# Patient Record
Sex: Male | Born: 1945 | Hispanic: No | Marital: Married | Smoking: Never smoker
Health system: Southern US, Community
[De-identification: ages and names within clinical notes are randomized; demographics above are authoritative.]

## PROBLEM LIST (undated history)

## (undated) DIAGNOSIS — I10 Essential (primary) hypertension: Secondary | ICD-10-CM

## (undated) DIAGNOSIS — E785 Hyperlipidemia, unspecified: Secondary | ICD-10-CM

## (undated) DIAGNOSIS — I878 Other specified disorders of veins: Secondary | ICD-10-CM

## (undated) HISTORY — PX: NO PAST SURGERIES: SHX2092

---

## 2011-09-05 ENCOUNTER — Ambulatory Visit: Payer: Self-pay | Admitting: Internal Medicine

## 2016-06-29 ENCOUNTER — Inpatient Hospital Stay
Admission: EM | Admit: 2016-06-29 | Discharge: 2016-07-01 | DRG: 603 | Disposition: A | Discharge status: Home / Self Care | Payer: Medicare Other | Attending: Internal Medicine | Admitting: Internal Medicine

## 2016-06-29 DIAGNOSIS — E669 Obesity, unspecified: Secondary | ICD-10-CM | POA: Diagnosis present

## 2016-06-29 DIAGNOSIS — L0390 Cellulitis, unspecified: Secondary | ICD-10-CM | POA: Diagnosis present

## 2016-06-29 DIAGNOSIS — E11622 Type 2 diabetes mellitus with other skin ulcer: Secondary | ICD-10-CM | POA: Diagnosis present

## 2016-06-29 DIAGNOSIS — I10 Essential (primary) hypertension: Secondary | ICD-10-CM | POA: Diagnosis present

## 2016-06-29 DIAGNOSIS — L03116 Cellulitis of left lower limb: Secondary | ICD-10-CM | POA: Diagnosis not present

## 2016-06-29 DIAGNOSIS — Z79899 Other long term (current) drug therapy: Secondary | ICD-10-CM

## 2016-06-29 DIAGNOSIS — E785 Hyperlipidemia, unspecified: Secondary | ICD-10-CM | POA: Diagnosis present

## 2016-06-29 DIAGNOSIS — Z6841 Body Mass Index (BMI) 40.0 and over, adult: Secondary | ICD-10-CM

## 2016-06-29 DIAGNOSIS — I83028 Varicose veins of left lower extremity with ulcer other part of lower leg: Secondary | ICD-10-CM | POA: Diagnosis present

## 2016-06-29 DIAGNOSIS — E119 Type 2 diabetes mellitus without complications: Secondary | ICD-10-CM | POA: Diagnosis present

## 2016-06-29 DIAGNOSIS — E1165 Type 2 diabetes mellitus with hyperglycemia: Secondary | ICD-10-CM | POA: Diagnosis present

## 2016-06-29 DIAGNOSIS — I878 Other specified disorders of veins: Secondary | ICD-10-CM | POA: Diagnosis present

## 2016-06-29 HISTORY — DX: Essential (primary) hypertension: I10

## 2016-06-29 HISTORY — DX: Hyperlipidemia, unspecified: E78.5

## 2016-06-29 HISTORY — DX: Other specified disorders of veins: I87.8

## 2016-06-29 LAB — CBC WITH DIFFERENTIAL/PLATELET
BASOS ABS: 0.3 10*3/uL — AB (ref 0–0.1)
BASOS PCT: 2 %
EOS ABS: 0.5 10*3/uL (ref 0–0.7)
Eosinophils Relative: 4 %
HEMATOCRIT: 42.4 % (ref 40.0–52.0)
HEMOGLOBIN: 14.2 g/dL (ref 13.0–18.0)
LYMPHS PCT: 25 %
Lymphs Abs: 3.6 10*3/uL (ref 1.0–3.6)
MCH: 26.4 pg (ref 26.0–34.0)
MCHC: 33.5 g/dL (ref 32.0–36.0)
MCV: 79 fL — ABNORMAL LOW (ref 80.0–100.0)
MONO ABS: 1 10*3/uL (ref 0.2–1.0)
MONOS PCT: 7 %
NEUTROS ABS: 9.1 10*3/uL — AB (ref 1.4–6.5)
Neutrophils Relative %: 62 %
Platelets: 290 10*3/uL (ref 150–440)
RBC: 5.37 MIL/uL (ref 4.40–5.90)
RDW: 14.3 % (ref 11.5–14.5)
WBC: 14.5 10*3/uL — AB (ref 3.8–10.6)

## 2016-06-29 LAB — BASIC METABOLIC PANEL
ANION GAP: 10 (ref 5–15)
BUN: 13 mg/dL (ref 6–20)
CHLORIDE: 100 mmol/L — AB (ref 101–111)
CO2: 25 mmol/L (ref 22–32)
Calcium: 9.6 mg/dL (ref 8.9–10.3)
Creatinine, Ser: 0.8 mg/dL (ref 0.61–1.24)
GFR calc non Af Amer: >60 mL/min (ref >60)
Glucose, Bld: 111 mg/dL — ABNORMAL HIGH (ref 65–99)
POTASSIUM: 3.9 mmol/L (ref 3.5–5.1)
SODIUM: 135 mmol/L (ref 135–145)

## 2016-06-29 LAB — LACTIC ACID, PLASMA: LACTIC ACID, VENOUS: 1.9 mmol/L (ref 0.5–1.9)

## 2016-06-29 LAB — GLUCOSE, CAPILLARY: GLUCOSE-CAPILLARY: 112 mg/dL — AB (ref 65–99)

## 2016-06-29 MED ORDER — CLINDAMYCIN PHOSPHATE 600 MG/50ML IV SOLN
600.0000 mg | Freq: Once | INTRAVENOUS | Status: AC
  Administered 2016-06-29: 600 mg via INTRAVENOUS
  Filled 2016-06-29: qty 50

## 2016-06-29 MED ORDER — CLINDAMYCIN HCL 300 MG PO CAPS: 300.0000 mg | ORAL_CAPSULE | Freq: Three times a day (TID) | ORAL | 0 refills | Status: DC

## 2016-06-29 NOTE — ED Notes (Signed)
Pt reports he is taking Augmentin twice a day since Monday

## 2016-06-29 NOTE — ED Notes (Signed)
Pt reports being bitten on left posterior leg approx 15 days ago, and treated by Dr Welton Flakes. Pt saw MD Su Hilt on Monday because he had 2 new blisters on his foot (no blisters prior). Pt's foot is not covered in blisters with weeping wound on medial and posterior leg

## 2016-06-29 NOTE — H&P (Signed)
[facility] - [place] at [facility]   PATIENT NAME: Jon Thomas    MR#:  [id number]  DATE OF BIRTH:  17-Jul-1946  DATE OF ADMISSION:  06/29/2016  PRIMARY CARE PHYSICIAN: Margaretann Loveless, MD   REQUESTING/REFERRING PHYSICIAN: Don Perking, MD  CHIEF COMPLAINT:   Chief Complaint  Patient presents with  . Open Wound  . Wound Infection    HISTORY OF PRESENT ILLNESS:  Jon Thomas  is a 70 y.o. male who presents with Progressive right lower extremity cellulitis. Patient states that he has chronic venous stasis in both legs, and about 2 weeks ago sustained some injury to his left lower extremity. He is not sure if it was from a bug bite or a plan scratch. He is taking an oral antibiotic in outpatient setting for some time, but his injury was not improving and he then developed blisters and cellulitis. He did have placement of what sounds like it may have been an Radio broadcast assistant in the outpatient setting, which still also did not help. Came to the ED for evaluation. Hospitals were initially called for consultation for admission IV antibiotics, but the patient states that he is alone and drives in his household and he cannot be admitted here due to the same.However, when the ED physician and back to speak with them to discharge and he stated that he did want to stay for a round of IV antibiotics and to see the wound consult team.  PAST MEDICAL HISTORY:   Past Medical History:  Diagnosis Date  . HLD (hyperlipidemia)   . Hypertension   . Venous stasis     PAST SURGICAL HISTOIRY:   Past Surgical History:  Procedure Laterality Date  . NO PAST SURGERIES      SOCIAL HISTORY:   Social History  Substance Use Topics  . Smoking status: Never Smoker  . Smokeless tobacco: Never Used  . Alcohol use No    FAMILY HISTORY:  No family history on file.  DRUG ALLERGIES:  No Known Allergies  REVIEW OF SYSTEMS:  Review of Systems  Constitutional: Negative for chills,  fever, malaise/fatigue and weight loss.  HENT: Negative for ear pain, hearing loss and tinnitus.   Eyes: Negative for blurred vision, double vision, pain and redness.  Respiratory: Negative for cough, hemoptysis and shortness of breath.   Cardiovascular: Negative for chest pain, palpitations, orthopnea and leg swelling.  Gastrointestinal: Negative for abdominal pain, constipation, diarrhea, nausea and vomiting.  Genitourinary: Negative for dysuria, frequency and hematuria.  Musculoskeletal: Negative for back pain, joint pain and neck pain.  Skin:       Right lower extremity cellulitis, wound, blisters  Neurological: Negative for dizziness, tremors, focal weakness and weakness.  Endo/Heme/Allergies: Negative for polydipsia. Does not bruise/bleed easily.  Psychiatric/Behavioral: Negative for depression. The patient is not nervous/anxious and does not have insomnia.     MEDICATIONS AT HOME:   Prior to Admission medications   Medication Sig Start Date End Date Taking? Authorizing Provider  amLODipine-atorvastatin (CADUET) 5-10 MG tablet Take 1 tablet by mouth daily.   Yes Historical Provider, MD  amoxicillin-clavulanate (AUGMENTIN) 875-125 MG tablet Take 1 tablet by mouth every 12 (twelve) hours. 06/25/16  Yes Historical Provider, MD  lisinopril (PRINIVIL,ZESTRIL) 5 MG tablet Take 5 mg by mouth daily.   Yes Historical Provider, MD  metoprolol succinate (TOPROL-XL) 25 MG 24 hr tablet Take 25 mg by mouth daily.   Yes Historical Provider, MD      VITAL SIGNS:   Vitals:  06/29/16 1646 06/29/16 2023  BP: (!) 159/66 (!) 165/62  Pulse: 99 90  Resp: 18 20  Temp: 98.1 F (36.7 C)   TempSrc: Oral   SpO2: 100% 97%  Weight: 102.5 kg (226 lb)   Height: 5\' 4"  (1.626 m)    Wt Readings from Last 3 Encounters:  06/29/16 102.5 kg (226 lb)    PHYSICAL EXAMINATION:  Physical Exam  Vitals reviewed. Constitutional: He is oriented to person, place, and time. He appears well-developed and  well-nourished. No distress.  HENT:  Head: Normocephalic and atraumatic.  Mouth/Throat: Oropharynx is clear and moist.  Eyes: Conjunctivae and EOM are normal. Pupils are equal, round, and reactive to light. No scleral icterus.  Neck: Normal range of motion. Neck supple. No JVD present. No thyromegaly present.  Cardiovascular: Normal rate, regular rhythm and intact distal pulses.  Exam reveals no gallop and no friction rub.   No murmur heard. Respiratory: Effort normal and breath sounds normal. No respiratory distress. He has no wheezes. He has no rales.  GI: Soft. Bowel sounds are normal. He exhibits no distension. There is no tenderness.  Musculoskeletal: Normal range of motion. He exhibits no edema.  No arthritis, no gout  Lymphadenopathy:    He has no cervical adenopathy.  Neurological: He is alert and oriented to person, place, and time. No cranial nerve deficit.  No dysarthria, no aphasia  Skin: Skin is warm and dry. No rash noted. There is erythema.  Right lower extremity medial wound with significant surrounding cellulitis and blisters across the top of his foot  Psychiatric: He has a normal mood and affect. His behavior is normal. Judgment and thought content normal.     LABORATORY PANEL:   CBC  Recent Labs Lab 06/29/16 1656  WBC 14.5*  HGB 14.2  HCT 42.4  PLT 290   ------------------------------------------------------------------------------------------------------------------  Chemistries   Recent Labs Lab 06/29/16 1656  NA 135  K 3.9  CL 100*  CO2 25  GLUCOSE 111*  BUN 13  CREATININE 0.80  CALCIUM 9.6   ------------------------------------------------------------------------------------------------------------------  Cardiac Enzymes No results for input(s): TROPONINI in the last 168 hours. ------------------------------------------------------------------------------------------------------------------  RADIOLOGY:  No results found.  EKG:  No  orders found for this or any previous visit.  IMPRESSION AND PLAN:  Principal Problem:   Cellulitis - Patient initially did not want to be admitted, and this that was originally placed as a consult note, but then the patient changed his mind and requested IV antibiotics. We'll admit him with IV antibiotics per cellulitis order set and a wound team consult for the morning.   HTN - currently stable, continue home meds   HLD - continue home meds  All the records are reviewed and case discussed with ED provider. Management plans discussed with the patient and/or family.  CODE STATUS: Full Code Status History    This patient does not have a recorded code status. Please follow your organizational policy for patients in this situation.      TOTAL TIME TAKING CARE OF THIS PATIENT: 45 minutes.    Sriyan Cutting FIELDING 06/29/2016, 11:32 PM  [facility]  Office  [phone]  CC: Primary care Physician: Margaretann Loveless, MD

## 2016-06-29 NOTE — ED Triage Notes (Addendum)
Pt arrives to ER via POV c/o left leg infection and left toe infection. Pt seen at [facility] last Tuesday for small wound. Pt states worsening of wound and now wound to left toe as well.   Wound to inner left lower leg and top of left foot, yellow exudate, crusts formed. Leg rewrapped.

## 2016-06-29 NOTE — ED Provider Notes (Addendum)
[facility] Emergency Department Provider Note  ____________________________________________  Time seen: Approximately 9:09 PM  I have reviewed the triage vital signs and the nursing notes.   HISTORY  Chief Complaint Open Wound and Wound Infection   HPI Jon Thomas is a 70 y.o. male history of hypertension and peripheral vascular disease and presents for evaluation of wound to his left lower extremity. Patient reports 2 weeks ago he was trying to clean his satellite dish near some plants when he felt a sting to his leg. He did not see any insects or plants with thorns. He developed a small ulceration. He was seen by his primary care doctor who started him on some topical antibiotics. The wound progressively worse and he was started on Augmentin on 7/31. He followed up at the opening clinic today and the wound had gotten progressively worse so he was sent here for evaluation. She denies pain, fever, nausea, vomiting, history of diabetes. He reports that the wound has gotten markedly larger and now has blisters all over his foot.  Past Medical History:  Diagnosis Date  . HLD (hyperlipidemia)   . Hypertension   . Venous stasis     Patient Active Problem List   Diagnosis Date Noted  . Cellulitis 06/29/2016  . HTN (hypertension) 06/29/2016  . HLD (hyperlipidemia) 06/29/2016    Past Surgical History:  Procedure Laterality Date  . NO PAST SURGERIES      Prior to Admission medications   Medication Sig Start Date End Date Taking? Authorizing Provider  amLODipine-atorvastatin (CADUET) 5-10 MG tablet Take 1 tablet by mouth daily.   Yes Historical Provider, MD  amoxicillin-clavulanate (AUGMENTIN) 875-125 MG tablet Take 1 tablet by mouth every 12 (twelve) hours. 06/25/16  Yes Historical Provider, MD  lisinopril (PRINIVIL,ZESTRIL) 5 MG tablet Take 5 mg by mouth daily.   Yes Historical Provider, MD  metoprolol succinate (TOPROL-XL) 25 MG 24 hr tablet Take 25  mg by mouth daily.   Yes Historical Provider, MD  clindamycin (CLEOCIN) 300 MG capsule Take 1 capsule (300 mg total) by mouth 3 (three) times daily. 06/29/16 07/09/16  Nita Sickle, MD    Allergies Review of patient's allergies indicates no known allergies.  No family history on file.  Social History Social History  Substance Use Topics  . Smoking status: Never Smoker  . Smokeless tobacco: Never Used  . Alcohol use No    Review of Systems  Constitutional: Negative for fever. Eyes: Negative for visual changes. ENT: Negative for sore throat. Cardiovascular: Negative for chest pain. Respiratory: Negative for shortness of breath. Gastrointestinal: Negative for abdominal pain, vomiting or diarrhea. Genitourinary: Negative for dysuria. Musculoskeletal: Negative for back pain. Skin: Negative for rash. + large wound ot LLE Neurological: Negative for headaches, weakness or numbness.  ____________________________________________   PHYSICAL EXAM:  VITAL SIGNS: ED Triage Vitals  Enc Vitals Group     BP 06/29/16 1646 (!) 159/66     Pulse Rate 06/29/16 1646 99     Resp 06/29/16 1646 18     Temp 06/29/16 1646 98.1 F (36.7 C)     Temp Source 06/29/16 1646 Oral     SpO2 06/29/16 1646 100 %     Weight 06/29/16 1646 226 lb (102.5 kg)     Height 06/29/16 1646 5\' 4"  (1.626 m)     Head Circumference --      Peak Flow --      Pain Score 06/29/16 1647 0     Pain Loc --  Pain Edu? --      Excl. in GC? --     Constitutional: Alert and oriented. Well appearing and in no apparent distress. HEENT:      Head: Normocephalic and atraumatic.         Eyes: Conjunctivae are normal. Sclera is non-icteric. EOMI. PERRL      Mouth/Throat: Mucous membranes are moist.       Neck: Supple with no signs of meningismus. Cardiovascular: Regular rate and rhythm. No murmurs, gallops, or rubs. 2+ symmetrical distal pulses are present in all extremities. No JVD. Respiratory: Normal respiratory  effort. Lungs are clear to auscultation bilaterally. No wheezes, crackles, or rhonchi.  Musculoskeletal: Nontender with normal range of motion in all extremities. No edema, cyanosis, or erythema of extremities. Neurologic: Normal speech and language. Face is symmetric. Moving all extremities. No gross focal neurologic deficits are appreciated. Skin: Patient has a large shallow laceration involving most of his calf on the left lower extremity with a bullae on the dorsum of his foot, there is purulent discharge and foul odor. Patient also has changes of chronic venous stasis on bilateral lower extremities.  Psychiatric: Mood and affect are normal. Speech and behavior are normal.  ____________________________________________   LABS (all labs ordered are listed, but only abnormal results are displayed)  Labs Reviewed  CBC WITH DIFFERENTIAL/PLATELET - Abnormal; Notable for the following:       Result Value   WBC 14.5 (*)    MCV 79.0 (*)    Neutro Abs 9.1 (*)    Basophils Absolute 0.3 (*)    All other components within normal limits  GLUCOSE, CAPILLARY - Abnormal; Notable for the following:    Glucose-Capillary 112 (*)    All other components within normal limits  BASIC METABOLIC PANEL - Abnormal; Notable for the following:    Chloride 100 (*)    Glucose, Bld 111 (*)    All other components within normal limits  AEROBIC CULTURE (SUPERFICIAL SPECIMEN)  LACTIC ACID, PLASMA  CBG MONITORING, ED   ____________________________________________  EKG  none ____________________________________________  RADIOLOGY  none  ____________________________________________   PROCEDURES  Procedure(s) performed: None Procedures Critical Care performed:  None ____________________________________________   INITIAL IMPRESSION / ASSESSMENT AND PLAN / ED COURSE   70 y.o. male history of hypertension and peripheral vascular disease and presents for evaluation of wound to his left lower extremity.  Patient has failed outpatient antibiotic and the wound has gotten progressively worse. Now it purulent discharge and foul smell, also with blisters on the dorsum of his foot. Patient has changes of chronic venous stasis. No fever. White count here is elevated at 14.5. Will start patient on IV clindamycin, sent lactate and blood cultures, and admit for IV antibiotics and wound care. Wound swab sent as well.  Clinical Course    Pertinent labs & imaging results that were available during my care of the patient were reviewed by me and considered in my medical decision making (see chart for details).    ____________________________________________   FINAL CLINICAL IMPRESSION(S) / ED DIAGNOSES  Final diagnoses:  Cellulitis of left lower extremity      NEW MEDICATIONS STARTED DURING THIS VISIT:  New Prescriptions   CLINDAMYCIN (CLEOCIN) 300 MG CAPSULE    Take 1 capsule (300 mg total) by mouth 3 (three) times daily.     Note:  This document was prepared using Dragon voice recognition software and may include unintentional dictation errors.    Nita Sickle, MD 06/29/16 [phone]  Nita Sickle, MD 06/29/16 [phone]

## 2016-06-29 NOTE — Consult Note (Deleted)
[facility] - [place] at [facility]   PATIENT NAME: Jon Thomas    MR#:  [id number]  DATE OF BIRTH:  February 03, 1946  DATE OF ADMISSION:  06/29/2016  PRIMARY CARE PHYSICIAN: Margaretann Loveless, MD   REQUESTING/REFERRING PHYSICIAN: Don Perking, MD  CHIEF COMPLAINT:   Chief Complaint  Patient presents with  . Open Wound  . Wound Infection    HISTORY OF PRESENT ILLNESS:  Jon Thomas  is a 70 y.o. male who presents with Progressive right lower extremity cellulitis. Patient states that he has chronic venous stasis in both legs, and about 2 weeks ago sustained some injury to his left lower extremity. He is not sure if it was from a bug bite or a plan scratch. He is taking an oral antibiotic in outpatient setting for some time, but his injury was not improving and he then developed blisters and cellulitis. He did have placement of what sounds like it may have been an Radio broadcast assistant in the outpatient setting, which still also did not help. Came to the ED for evaluation. Hospitals were initially called for consultation for admission IV antibiotics, but the patient states that he is alone and drives in his household and he cannot be admitted here due to the same.  PAST MEDICAL HISTORY:   Past Medical History:  Diagnosis Date  . HLD (hyperlipidemia)   . Hypertension   . Venous stasis     PAST SURGICAL HISTOIRY:   Past Surgical History:  Procedure Laterality Date  . NO PAST SURGERIES      SOCIAL HISTORY:   Social History  Substance Use Topics  . Smoking status: Never Smoker  . Smokeless tobacco: Never Used  . Alcohol use No    FAMILY HISTORY:  No family history on file.  DRUG ALLERGIES:  No Known Allergies  REVIEW OF SYSTEMS:  Review of Systems  Constitutional: Negative for chills, fever, malaise/fatigue and weight loss.  HENT: Negative for ear pain, hearing loss and tinnitus.   Eyes: Negative for blurred vision, double vision, pain and redness.   Respiratory: Negative for cough, hemoptysis and shortness of breath.   Cardiovascular: Negative for chest pain, palpitations, orthopnea and leg swelling.  Gastrointestinal: Negative for abdominal pain, constipation, diarrhea, nausea and vomiting.  Genitourinary: Negative for dysuria, frequency and hematuria.  Musculoskeletal: Negative for back pain, joint pain and neck pain.  Skin:       Right lower extremity cellulitis, wound, blisters  Neurological: Negative for dizziness, tremors, focal weakness and weakness.  Endo/Heme/Allergies: Negative for polydipsia. Does not bruise/bleed easily.  Psychiatric/Behavioral: Negative for depression. The patient is not nervous/anxious and does not have insomnia.     MEDICATIONS AT HOME:   Prior to Admission medications   Medication Sig Start Date End Date Taking? Authorizing Provider  amLODipine-atorvastatin (CADUET) 5-10 MG tablet Take 1 tablet by mouth daily.   Yes Historical Provider, MD  amoxicillin-clavulanate (AUGMENTIN) 875-125 MG tablet Take 1 tablet by mouth every 12 (twelve) hours. 06/25/16  Yes Historical Provider, MD  lisinopril (PRINIVIL,ZESTRIL) 5 MG tablet Take 5 mg by mouth daily.   Yes Historical Provider, MD  metoprolol succinate (TOPROL-XL) 25 MG 24 hr tablet Take 25 mg by mouth daily.   Yes Historical Provider, MD      VITAL SIGNS:   Vitals:   06/29/16 1646 06/29/16 2023  BP: (!) 159/66 (!) 165/62  Pulse: 99 90  Resp: 18 20  Temp: 98.1 F (36.7 C)   TempSrc: Oral   SpO2: 100%  97%  Weight: 102.5 kg (226 lb)   Height:  (1.626 m)    Wt Readings from Last 3 Encounters:  06/29/16 102.5 kg (226 lb)    PHYSICAL EXAMINATION:  Physical Exam  Vitals reviewed. Constitutional: He is oriented to person, place, and time. He appears well-developed and well-nourished. No distress.  HENT:  Head: Normocephalic and atraumatic.  Mouth/Throat: Oropharynx is clear and moist.  Eyes: Conjunctivae and EOM are normal. Pupils are  equal, round, and reactive to light. No scleral icterus.  Neck: Normal range of motion. Neck supple. No JVD present. No thyromegaly present.  Cardiovascular: Normal rate, regular rhythm and intact distal pulses.  Exam reveals no gallop and no friction rub.   No murmur heard. Respiratory: Effort normal and breath sounds normal. No respiratory distress. He has no wheezes. He has no rales.  GI: Soft. Bowel sounds are normal. He exhibits no distension. There is no tenderness.  Musculoskeletal: Normal range of motion. He exhibits no edema.  No arthritis, no gout  Lymphadenopathy:    He has no cervical adenopathy.  Neurological: He is alert and oriented to person, place, and time. No cranial nerve deficit.  No dysarthria, no aphasia  Skin: Skin is warm and dry. No rash noted. There is erythema.  Right lower extremity medial wound with significant surrounding cellulitis and blisters across the top of his foot  Psychiatric: He has a normal mood and affect. His behavior is normal. Judgment and thought content normal.     LABORATORY PANEL:   CBC  Recent Labs Lab 06/29/16 1656  WBC 14.5*  HGB 14.2  HCT 42.4  PLT 290   ------------------------------------------------------------------------------------------------------------------  Chemistries   Recent Labs Lab 06/29/16 1656  NA 135  K 3.9  CL 100*  CO2 25  GLUCOSE 111*  BUN 13  CREATININE 0.80  CALCIUM 9.6   ------------------------------------------------------------------------------------------------------------------  Cardiac Enzymes No results for input(s): TROPONINI in the last 168 hours. ------------------------------------------------------------------------------------------------------------------  RADIOLOGY:  No results found.  EKG:  No orders found for this or any previous visit.  IMPRESSION AND PLAN:  Principal Problem:   Cellulitis - Patient initially did not want to be admitted, and this that was  originally placed as a consult note, but then the patient changed his mind and requested IV antibiotics. We'll admit him with IV antibiotics per cellulitis order set and a wound team consult for the morning.   HTN - currently stable, continue home meds   HLD - continue home meds  All the records are reviewed and case discussed with ED provider. Management plans discussed with the patient and/or family.  CODE STATUS: Full Code Status History    This patient does not have a recorded code status. Please follow your organizational policy for patients in this situation.      TOTAL TIME TAKING CARE OF THIS PATIENT: 45 minutes.    Corrin Hingle FIELDING 06/29/2016, 11:32 PM  [facility]  Office  [phone]  CC: Primary care Physician: Margaretann Loveless, MD

## 2016-06-30 DIAGNOSIS — I10 Essential (primary) hypertension: Secondary | ICD-10-CM | POA: Diagnosis present

## 2016-06-30 DIAGNOSIS — E119 Type 2 diabetes mellitus without complications: Secondary | ICD-10-CM | POA: Diagnosis present

## 2016-06-30 DIAGNOSIS — L03116 Cellulitis of left lower limb: Secondary | ICD-10-CM | POA: Diagnosis present

## 2016-06-30 DIAGNOSIS — I878 Other specified disorders of veins: Secondary | ICD-10-CM | POA: Diagnosis present

## 2016-06-30 DIAGNOSIS — Z79899 Other long term (current) drug therapy: Secondary | ICD-10-CM | POA: Diagnosis not present

## 2016-06-30 DIAGNOSIS — E11622 Type 2 diabetes mellitus with other skin ulcer: Secondary | ICD-10-CM | POA: Diagnosis present

## 2016-06-30 DIAGNOSIS — E785 Hyperlipidemia, unspecified: Secondary | ICD-10-CM | POA: Diagnosis present

## 2016-06-30 DIAGNOSIS — I83028 Varicose veins of left lower extremity with ulcer other part of lower leg: Secondary | ICD-10-CM | POA: Diagnosis present

## 2016-06-30 DIAGNOSIS — Z6841 Body Mass Index (BMI) 40.0 and over, adult: Secondary | ICD-10-CM | POA: Diagnosis not present

## 2016-06-30 DIAGNOSIS — E669 Obesity, unspecified: Secondary | ICD-10-CM | POA: Diagnosis present

## 2016-06-30 DIAGNOSIS — E1165 Type 2 diabetes mellitus with hyperglycemia: Secondary | ICD-10-CM | POA: Diagnosis present

## 2016-06-30 LAB — BASIC METABOLIC PANEL
Anion gap: 9 (ref 5–15)
BUN: 11 mg/dL (ref 6–20)
CHLORIDE: 102 mmol/L (ref 101–111)
CO2: 25 mmol/L (ref 22–32)
CREATININE: 0.85 mg/dL (ref 0.61–1.24)
Calcium: 8.8 mg/dL — ABNORMAL LOW (ref 8.9–10.3)
GFR calc non Af Amer: >60 mL/min (ref >60)
Glucose, Bld: 140 mg/dL — ABNORMAL HIGH (ref 65–99)
Potassium: 3.9 mmol/L (ref 3.5–5.1)
Sodium: 136 mmol/L (ref 135–145)

## 2016-06-30 LAB — CBC
HCT: 40.1 % (ref 40.0–52.0)
HEMOGLOBIN: 13.2 g/dL (ref 13.0–18.0)
MCH: 26.2 pg (ref 26.0–34.0)
MCHC: 32.9 g/dL (ref 32.0–36.0)
MCV: 79.5 fL — AB (ref 80.0–100.0)
PLATELETS: 267 10*3/uL (ref 150–440)
RBC: 5.05 MIL/uL (ref 4.40–5.90)
RDW: 14.5 % (ref 11.5–14.5)
WBC: 13.1 10*3/uL — ABNORMAL HIGH (ref 3.8–10.6)

## 2016-06-30 LAB — HEMOGLOBIN A1C: Hgb A1c MFr Bld: 7.6 % — ABNORMAL HIGH (ref 4.0–6.0)

## 2016-06-30 MED ORDER — VANCOMYCIN HCL 10 G IV SOLR
1250.0000 mg | Freq: Two times a day (BID) | INTRAVENOUS | Status: DC
  Administered 2016-06-30 – 2016-07-01 (×3): 1250 mg via INTRAVENOUS
  Filled 2016-06-30 (×4): qty 1250

## 2016-06-30 MED ORDER — METOPROLOL SUCCINATE ER 25 MG PO TB24
25.0000 mg | ORAL_TABLET | Freq: Every day | ORAL | Status: DC
  Administered 2016-06-30: 25 mg via ORAL
  Filled 2016-06-30: qty 1

## 2016-06-30 MED ORDER — VANCOMYCIN HCL IN DEXTROSE 1-5 GM/200ML-% IV SOLN
1000.0000 mg | Freq: Once | INTRAVENOUS | Status: DC
  Filled 2016-06-30: qty 200

## 2016-06-30 MED ORDER — PIPERACILLIN-TAZOBACTAM 3.375 G IVPB
3.3750 g | Freq: Three times a day (TID) | INTRAVENOUS | Status: DC
  Administered 2016-06-30 – 2016-07-01 (×5): 3.375 g via INTRAVENOUS
  Filled 2016-06-30 (×7): qty 50

## 2016-06-30 MED ORDER — VANCOMYCIN HCL 10 G IV SOLR
1250.0000 mg | Freq: Once | INTRAVENOUS | Status: AC
  Administered 2016-06-30: 1250 mg via INTRAVENOUS
  Filled 2016-06-30: qty 1250

## 2016-06-30 MED ORDER — LISINOPRIL 5 MG PO TABS
5.0000 mg | ORAL_TABLET | Freq: Every day | ORAL | Status: DC
  Administered 2016-06-30 – 2016-07-01 (×2): 5 mg via ORAL
  Filled 2016-06-30 (×2): qty 1

## 2016-06-30 MED ORDER — AMLODIPINE BESYLATE 5 MG PO TABS
5.0000 mg | ORAL_TABLET | Freq: Every day | ORAL | Status: DC
  Administered 2016-06-30: 5 mg via ORAL
  Filled 2016-06-30: qty 1

## 2016-06-30 MED ORDER — AMLODIPINE BESYLATE 5 MG PO TABS
5.0000 mg | ORAL_TABLET | Freq: Every day | ORAL | Status: DC
  Administered 2016-07-01: 5 mg via ORAL
  Filled 2016-06-30: qty 1

## 2016-06-30 MED ORDER — PIPERACILLIN-TAZOBACTAM 3.375 G IVPB 30 MIN
3.3750 g | Freq: Once | INTRAVENOUS | Status: DC
  Filled 2016-06-30: qty 50

## 2016-06-30 MED ORDER — ENOXAPARIN SODIUM 40 MG/0.4ML ~~LOC~~ SOLN
40.0000 mg | Freq: Every day | SUBCUTANEOUS | Status: DC
  Administered 2016-06-30 (×2): 40 mg via SUBCUTANEOUS
  Filled 2016-06-30 (×2): qty 0.4

## 2016-06-30 MED ORDER — METOPROLOL SUCCINATE ER 25 MG PO TB24
25.0000 mg | ORAL_TABLET | Freq: Every day | ORAL | Status: DC
  Filled 2016-06-30: qty 1

## 2016-06-30 MED ORDER — AMLODIPINE-ATORVASTATIN 5-10 MG PO TABS: 1.0000 | ORAL_TABLET | Freq: Every day | ORAL | Status: DC

## 2016-06-30 MED ORDER — ONDANSETRON HCL 4 MG/2ML IJ SOLN: 4.0000 mg | Freq: Four times a day (QID) | INTRAMUSCULAR | Status: DC | PRN

## 2016-06-30 MED ORDER — ATORVASTATIN CALCIUM 10 MG PO TABS
10.0000 mg | ORAL_TABLET | Freq: Every day | ORAL | Status: DC
  Administered 2016-06-30: 10 mg via ORAL
  Filled 2016-06-30: qty 1

## 2016-06-30 MED ORDER — OXYCODONE HCL 5 MG PO TABS: 5.0000 mg | ORAL_TABLET | ORAL | Status: DC | PRN

## 2016-06-30 MED ORDER — ACETAMINOPHEN 325 MG PO TABS: 650.0000 mg | ORAL_TABLET | Freq: Four times a day (QID) | ORAL | Status: DC | PRN

## 2016-06-30 MED ORDER — ONDANSETRON HCL 4 MG PO TABS: 4.0000 mg | ORAL_TABLET | Freq: Four times a day (QID) | ORAL | Status: DC | PRN

## 2016-06-30 MED ORDER — ACETAMINOPHEN 650 MG RE SUPP: 650.0000 mg | Freq: Four times a day (QID) | RECTAL | Status: DC | PRN

## 2016-06-30 MED ORDER — ATORVASTATIN CALCIUM 10 MG PO TABS
10.0000 mg | ORAL_TABLET | Freq: Every day | ORAL | Status: DC
  Filled 2016-06-30: qty 1

## 2016-06-30 NOTE — Progress Notes (Signed)
Patient has wound consult.  Have called all three numbers we have listed in our directory, [phone], [phone] and [phone]. No response.  Patient frustrated, Dr. Allena Katz aware. Loel Ro, RN 06/30/16 at [phone]

## 2016-06-30 NOTE — Progress Notes (Signed)
Pharmacy Antibiotic Note  Jon Thomas is a 70 y.o. male admitted on 06/29/2016 with cellulitis.  Pharmacy has been consulted for Zosyn and vancomycin dosing.  Plan: 1. Zosyn 3.375 gm IV Q8H EI 2. Vancomycin 1.25 gm IV x 1 followed in 6 hours (stacked dosing) by vancomycin 1.25 gm IV Q12H, predicted trough 15 mcg/mL. Pharmacy will continue to follow and adjust as needed to maintain trough 10 to 15 mcg/mL.   Vd 53.6 L, Ke 0.082 hr-1, T1/2 8.5 hr  Height: 5\' 4"  (162.6 cm) Weight: 226 lb (102.5 kg) IBW/kg (Calculated) : 59.2  Temp (24hrs), Avg:98.2 F (36.8 C), Min:98.1 F (36.7 C), Max:98.2 F (36.8 C)   Recent Labs Lab 06/29/16 1656 06/29/16 2150  WBC 14.5*  --   CREATININE 0.80  --   LATICACIDVEN  --  1.9    Estimated Creatinine Clearance: 93 mL/min (by C-G formula based on SCr of 0.8 mg/dL).    No Known Allergies  Thank you for allowing pharmacy to be a part of this patient's care.  Carola Frost, Pharm.D., BCPS Clinical Pharmacist 06/30/2016 1:37 AM

## 2016-06-30 NOTE — Progress Notes (Signed)
Initial Nutrition Assessment  DOCUMENTATION CODES:   Obesity unspecified  INTERVENTION:  -Discussed foods high in protein to meet vegetarian meal plan.  Handout provided to pt and wife.  Pt verbalized understanding and expect good compliance. Pt familiar with good sources of protein. -Pt may benefit from adding juven BID for wound healing and adding MVI.   NUTRITION DIAGNOSIS:    (none at this time) related to   as evidenced by  .    GOAL:   Patient will meet greater than or equal to 90% of their needs    MONITOR:   PO intake  REASON FOR ASSESSMENT:   Consult    ASSESSMENT:      Pt admitted with lower extremity cellulitis/wound.  Has chronic venous statis in both legs about 2 weeks ago sustained injury to left lower extremity.    Past Medical History:  Diagnosis Date  . HLD (hyperlipidemia)   . Hypertension   . Venous stasis    Pt reports good appetite prior to admission. Pt is a vegetarian but eats dairy foods (yogurt, milk), soy products, beans, nuts and nut butters.  Medications reviewed: Labs reviewed  Diet Order:  Diet Heart Room service appropriate? Yes; Fluid consistency: Thin  Skin:   (wound left leg/cellulitis)  Last BM:  PTA  Height:   Ht Readings from Last 1 Encounters:  06/29/16 5\' 4"  (1.626 m)    Weight:   Wt Readings from Last 1 Encounters:  06/30/16 262 lb 3.2 oz (118.9 kg)    Ideal Body Weight:     BMI:  Body mass index is 45.01 kg/m.  Estimated Nutritional Needs:   Kcal:  [phone] kcals/d  Protein:  93-111 g/d  Fluid:  >/= 1830ml/d  EDUCATION NEEDS:   Education needs addressed  Kaleah Hagemeister B. Freida Busman, RD, LDN [phone] (pager) Weekend/On-Call pager [phone])

## 2016-07-01 LAB — GLUCOSE, CAPILLARY: Glucose-Capillary: 189 mg/dL — ABNORMAL HIGH (ref 65–99)

## 2016-07-01 MED ORDER — METFORMIN HCL 500 MG PO TABS
500.0000 mg | ORAL_TABLET | Freq: Every day | ORAL | 1 refills | Status: AC
Start: 2016-07-01 — End: ?

## 2016-07-01 MED ORDER — METFORMIN HCL 500 MG PO TABS
500.0000 mg | ORAL_TABLET | Freq: Every day | ORAL | Status: DC
  Administered 2016-07-01: 500 mg via ORAL
  Filled 2016-07-01: qty 1

## 2016-07-01 MED ORDER — METFORMIN HCL 500 MG PO TABS: 500.0000 mg | ORAL_TABLET | Freq: Every day | ORAL | Status: DC

## 2016-07-01 MED ORDER — METFORMIN HCL 500 MG PO TABS: 500.0000 mg | ORAL_TABLET | Freq: Two times a day (BID) | ORAL | Status: DC

## 2016-07-01 MED ORDER — INSULIN ASPART 100 UNIT/ML ~~LOC~~ SOLN: 0.0000 [IU] | Freq: Three times a day (TID) | SUBCUTANEOUS | Status: DC

## 2016-07-01 NOTE — Discharge Instructions (Signed)
Left tibial shin ulcer dressing with vaseline gauze and wrap with bandage daily.  Check your blood sugar atleast 2 times a day and keep log of it

## 2016-07-01 NOTE — Discharge Summary (Addendum)
[facility] [place] at [facility]   PATIENT NAME: Jon Thomas    MR#:  [id number]  DATE OF BIRTH:  1946/01/18  DATE OF ADMISSION:  06/29/2016 ADMITTING PHYSICIAN: Oralia Manisavid Willis, MD  DATE OF DISCHARGE: 07/01/16  PRIMARY CARE PHYSICIAN: Barbette ReichmannHANDE,VISHWANATH, MD    ADMISSION DIAGNOSIS:  Cellulitis of left lower extremity [L03.116]  DISCHARGE DIAGNOSIS:  Left Tibial shin ulcer with cellulitis HTN DM-2 (New diagnosis)  SECONDARY DIAGNOSIS:   Past Medical History:  Diagnosis Date  . HLD (hyperlipidemia)   . Hypertension   . Venous stasis     HOSPITAL COURSE:  Jon Thomas  is a 70 y.o. male who presents with Progressive right lower extremity cellulitis. Patient states that he has chronic venous stasis in both legs, and about 2 weeks ago sustained some injury to his left lower extremity. He is not sure if it was from a bug bite or a plan scratch. He is taking an oral antibiotic in outpatient setting for some time, but his injury was not improving and he then developed blisters and cellulitis  1. Left tibial shin ulcer ,non healing with Cellulitis  And chronic venous insufficiency -IV zosyn and vancomycin -WBC stable -no fever -seen by Dr Wyn Quakerew. Recommends dressing changes and f/u as outpt (vaseline gauze and wrap with bandage daily) -HHRN for dressing changes Change to oral augmentin (pt has 6 more days of rx left)    2. HTN - currently stable, continue home meds  3. New onset DM-2 with A1c of 7.6% -advised diet changes and started on metformin 500 mg daily -pt has glucometer. Advised to check sugars at home and keep log of it   4. HLD - continue home meds  Overall stable. Pt requestigng to go home due to issues with wife's job ride and taking care of bills at home Will d/c pt home   CONSULTS OBTAINED:  Treatment Team:  Annice NeedyJason S Dew, MD  DRUG ALLERGIES:  No Known Allergies  DISCHARGE MEDICATIONS:   Current Discharge Medication List     START taking these medications   Details  metFORMIN (GLUCOPHAGE) 500 MG tablet Take 1 tablet (500 mg total) by mouth daily with breakfast. Qty: 90 tablet, Refills: 1      CONTINUE these medications which have NOT CHANGED   Details  amLODipine-atorvastatin (CADUET) 5-10 MG tablet Take 1 tablet by mouth daily.    amoxicillin-clavulanate (AUGMENTIN) 875-125 MG tablet Take 1 tablet by mouth every 12 (twelve) hours. Refills: 0    lisinopril (PRINIVIL,ZESTRIL) 5 MG tablet Take 5 mg by mouth daily.    metoprolol succinate (TOPROL-XL) 25 MG 24 hr tablet Take 25 mg by mouth daily.        If you experience worsening of your admission symptoms, develop shortness of breath, life threatening emergency, suicidal or homicidal thoughts you must seek medical attention immediately by calling 911 or calling your MD immediately  if symptoms less severe.  You Must read complete instructions/literature along with all the possible adverse reactions/side effects for all the Medicines you take and that have been prescribed to you. Take any new Medicines after you have completely understood and accept all the possible adverse reactions/side effects.   Please note  You were cared for by a hospitalist during your hospital stay. If you have any questions about your discharge medications or the care you received while you were in the hospital after you are discharged, you can call the unit and asked to speak with the hospitalist  on call if the hospitalist that took care of you is not available. Once you are discharged, your primary care physician will handle any further medical issues. Please note that NO REFILLS for any discharge medications will be authorized once you are discharged, as it is imperative that you return to your primary care physician (or establish a relationship with a primary care physician if you do not have one) for your aftercare needs so that they can reassess your need for medications and  monitor your lab values. Today   SUBJECTIVE   Improving slowly. No new complaints  VITAL SIGNS:  Blood pressure (!) 152/57, pulse 89, temperature 97.9 F (36.6 C), temperature source Oral, resp. rate 20, height  (1.626 m), weight 118.9 kg (262 lb 3.2 oz), SpO2 97 %.  I/O:   Intake/Output Summary (Last 24 hours) at 07/01/16 1132 Last data filed at 07/01/16 0900  Gross per 24 hour  Intake             1096 ml  Output              100 ml  Net              996 ml    PHYSICAL EXAMINATION:  GENERAL:  70 y.o.-year-old patient lying in the bed with no acute distress. Morbid obesity EYES: Pupils equal, round, reactive to light and accommodation. No scleral icterus. Extraocular muscles intact.  HEENT: Head atraumatic, normocephalic. Oropharynx and nasopharynx clear.  NECK:  Supple, no jugular venous distention. No thyroid enlargement, no tenderness.  LUNGS: Normal breath sounds bilaterally, no wheezing, rales,rhonchi or crepitation. No use of accessory muscles of respiration.  CARDIOVASCULAR: S1, S2 normal. No murmurs, rubs, or gallops.  ABDOMEN: Soft, non-tender, non-distended. Bowel sounds present. No organomegaly or mass.  EXTREMITIES: No pedal edema, cyanosis, or clubbing.chronic venous insufficiency and ulcer on the tibial shin  NEUROLOGIC: Cranial nerves II through XII are intact. Muscle strength 5/5 in all extremities. Sensation intact. Gait not checked.  PSYCHIATRIC: The patient is alert and oriented x 3.  SKIN: No obvious rash, lesion  DATA REVIEW:   CBC   Recent Labs Lab 06/30/16 0557  WBC 13.1*  HGB 13.2  HCT 40.1  PLT 267    Chemistries   Recent Labs Lab 06/30/16 0557  NA 136  K 3.9  CL 102  CO2 25  GLUCOSE 140*  BUN 11  CREATININE 0.85  CALCIUM 8.8*    Microbiology Results   Recent Results (from the past 240 hour(s))  Aerobic Culture (superficial specimen)     Status: None (Preliminary result)   Collection Time: 06/29/16  9:50 PM  Result  Value Ref Range Status   Specimen Description WOUND LEFT LEG  Final   Special Requests NONE  Final   Gram Stain   Final    FEW WBC PRESENT,BOTH PMN AND MONONUCLEAR FEW GRAM POSITIVE COCCI IN PAIRS ABUNDANT GRAM NEGATIVE RODS RARE SQUAMOUS EPITHELIAL CELLS PRESENT Performed at [facility]    Culture PENDING  Incomplete   Report Status PENDING  Incomplete    RADIOLOGY:  No results found.   Management plans discussed with the patient, family and they are in agreement.  CODE STATUS:     Code Status Orders        Start     Ordered   06/30/16 0131  Full code  Continuous     06/30/16 0131    Code Status History    Date Active Date Inactive  Code Status Order ID Comments User Context   This patient has a current code status but no historical code status.      TOTAL TIME TAKING CARE OF THIS PATIENT: 40 minutes.    Krizia Flight M.D on 07/01/2016 at 11:32 AM  Between 7am to 6pm - Pager - [phone] After 6pm go to [email or web address] - password [email or web address] [facility]  [place] [facility]  Office  [phone]  CC: Primary care physician; Barbette Reichmann, MD

## 2016-07-01 NOTE — Progress Notes (Signed)
[facility] [place] at [facility]   PATIENT NAME: Jon Thomas    MR#:  [id number]  DATE OF BIRTH:  08-Mar-1946  SUBJECTIVE:  Came in with leg ulcer and blisters for >10 days. Some injury on the left tibial shin  REVIEW OF SYSTEMS:   Review of Systems  Constitutional: Negative for chills, fever and weight loss.  HENT: Negative for ear discharge, ear pain and nosebleeds.   Eyes: Negative for blurred vision, pain and discharge.  Respiratory: Negative for sputum production, shortness of breath, wheezing and stridor.   Cardiovascular: Negative for chest pain, palpitations, orthopnea and PND.  Gastrointestinal: Negative for abdominal pain, diarrhea, nausea and vomiting.  Genitourinary: Negative for frequency and urgency.  Musculoskeletal: Negative for back pain and joint pain.  Skin:       Ulcer, nonhealing on left leg with blisters  Neurological: Negative for sensory change, speech change, focal weakness and weakness.  Psychiatric/Behavioral: Negative for depression and hallucinations. The patient is not nervous/anxious.   All other systems reviewed and are negative.  Tolerating Diet:reg Tolerating PT: not needed  DRUG ALLERGIES:  No Known Allergies  VITALS:  Blood pressure (!) 152/57, pulse 89, temperature 97.9 F (36.6 C), temperature source Oral, resp. rate 20, height 5\' 4"  (1.626 m), weight 118.9 kg (262 lb 3.2 oz), SpO2 97 %.  PHYSICAL EXAMINATION:   Physical Exam  GENERAL:  70 y.o.-year-old patient lying in the bed with no acute distress.  EYES: Pupils equal, round, reactive to light and accommodation. No scleral icterus. Extraocular muscles intact.  HEENT: Head atraumatic, normocephalic. Oropharynx and nasopharynx clear.  NECK:  Supple, no jugular venous distention. No thyroid enlargement, no tenderness.  LUNGS: Normal breath sounds bilaterally, no wheezing, rales, rhonchi. No use of accessory muscles of respiration.  CARDIOVASCULAR: S1,  S2 normal. No murmurs, rubs, or gallops.  ABDOMEN: Soft, nontender, nondistended. Bowel sounds present. No organomegaly or mass.  EXTREMITIES: No cyanosis, clubbing or edema b/l.   Chronic bilateral LE venous insufficiency with left tibial shin ulcer and foot blisters NEUROLOGIC: Cranial nerves II through XII are intact. No focal Motor or sensory deficits b/l.   PSYCHIATRIC:  patient is alert and oriented x 3.  SKIN: No obvious rash, lesion   LABORATORY PANEL:  CBC  Recent Labs Lab 06/30/16 0557  WBC 13.1*  HGB 13.2  HCT 40.1  PLT 267    Chemistries   Recent Labs Lab 06/30/16 0557  NA 136  K 3.9  CL 102  CO2 25  GLUCOSE 140*  BUN 11  CREATININE 0.85  CALCIUM 8.8*   Cardiac Enzymes No results for input(s): TROPONINI in the last 168 hours. RADIOLOGY:  No results found. ASSESSMENT AND PLAN:  Jon Thomas  is a 70 y.o. male who presents with Progressive right lower extremity cellulitis. Patient states that he has chronic venous stasis in both legs, and about 2 weeks ago sustained some injury to his left lower extremity. He is not sure if it was from a bug bite or a plan scratch. He is taking an oral antibiotic in outpatient setting for some time, but his injury was not improving and he then developed blisters and cellulitis  1. Left tibial shin ulcer ,non healing with Cellulitis  And chronic venous insufficiency -IV zosyn and vancomycin -WBC stable -no fever -seen by Dr Wyn Quakerew. Recommends dressing changes and f/u as outpt (vaseline gauze and wrap with bandage daily) -HHRN for dressing changes    2. HTN - currently stable,  continue home meds  3. Hyperglycemia Check A1c   4. HLD - continue home meds   Case discussed with Care Management/Social Worker. Management plans discussed with the patient, family and they are in agreement.  CODE STATUS full DVT Prophylaxis: lovenox  TOTAL TIME TAKING CARE OF THIS PATIENT: 30 minutes.  >50% time spent on counselling and  coordination of care  POSSIBLE D/C IN 1-2DAYS, DEPENDING ON CLINICAL CONDITION.  Note: This dictation was prepared with Dragon dictation along with smaller phrase technology. Any transcriptional errors that result from this process are unintentional.  Jon Thomas M.D o  Between 7am to 6pm - Pager - [phone]  After 6pm go to [email or web address] - password [email or web address] [facility]  [facility] [place] [facility]  Office  [phone]  CC: Primary care physician; Barbette Reichmann, MD

## 2016-07-01 NOTE — Care Management Note (Signed)
Case Management Note  Patient Details  Name: Jon Thomas MRN: [id number] Date of Birth: 11/30/1945  Subjective/Objective:     Discussed discharge planning with [name]. He agreed upon [facility] and a referral was faxed to [facility] requesting HH-RN for wound management and to teach family how to do dressing changes. Explained to [name] that [facility] would call him at home to schedule an appointment.               Action/Plan:   Expected Discharge Date:                  Expected Discharge Plan:     In-House Referral:     Discharge planning Services     Post Acute Care Choice:    Choice offered to:     DME Arranged:    DME Agency:     HH Arranged:    HH Agency:     Status of Service:     If discussed at [employer] Length of Stay Meetings, dates discussed:    Additional Comments:  Davan Hark A, RN 07/01/2016, 11:59 AM

## 2016-07-01 NOTE — Consult Note (Signed)
[facility] Vascular Consult Note  MRN : [id number]  Jon Thomas is a 70 y.o. (1946/06/24) male who presents with chief complaint of  Chief Complaint  Patient presents with  . Open Wound  . Wound Infection  .  History of Present Illness: Patient admitted to the hospital with ulceration and cellulitis of the left lower extremity. I am asked by Dr. Allena Katz to see the patient regarding his evaluation and ongoing care. The patient reports an episode about 15 years ago with what sounds like a stasis ulceration which eventually healed. He has dark discoloration of both legs, but denies any pain. He is obese and he has chronic swelling in his legs laterally. This episode developed without any trauma or inciting event. He reports he had an Unna boot placed by his primary care physician early last week but this made it much worse. I suspect he artery had significant cellulitis he still has redness and erythema although according to the patient this is improved from what it was at admission. He had some chills but no fever since he has been in the hospital. He is anxious to go home and is denying any pain this morning. He thinks the antibiotics have helped. He is also stayed in the bed with his leg elevated and has been getting dressing changes from the nursing staff.  Current Facility-Administered Medications  Medication Dose Route Frequency Provider Last Rate Last Dose  . acetaminophen (TYLENOL) tablet 650 mg  650 mg Oral Q6H PRN Oralia Manis, MD       Or  . acetaminophen (TYLENOL) suppository 650 mg  650 mg Rectal Q6H PRN Oralia Manis, MD      . atorvastatin (LIPITOR) tablet 10 mg  10 mg Oral QHS Enedina Finner, MD   10 mg at 06/30/16 2218   And  . amLODipine (NORVASC) tablet 5 mg  5 mg Oral Daily Enedina Finner, MD   5 mg at 07/01/16 0956  . enoxaparin (LOVENOX) injection 40 mg  40 mg Subcutaneous QHS Oralia Manis, MD   40 mg at 06/30/16 2218  . lisinopril (PRINIVIL,ZESTRIL)  tablet 5 mg  5 mg Oral Daily Oralia Manis, MD   5 mg at 07/01/16 0955  . metoprolol succinate (TOPROL-XL) 24 hr tablet 25 mg  25 mg Oral QHS Enedina Finner, MD   25 mg at 06/30/16 2219  . ondansetron (ZOFRAN) tablet 4 mg  4 mg Oral Q6H PRN Oralia Manis, MD       Or  . ondansetron [facility]) injection 4 mg  4 mg Intravenous Q6H PRN Oralia Manis, MD      . oxyCODONE (Oxy IR/ROXICODONE) immediate release tablet 5 mg  5 mg Oral Q4H PRN Oralia Manis, MD      . piperacillin-tazobactam (ZOSYN) IVPB 3.375 g  3.375 g Intravenous Q8H Oralia Manis, MD   3.375 g at 07/01/16 0956  . vancomycin (VANCOCIN) 1,250 mg in sodium chloride 0.9 % 250 mL IVPB  1,250 mg Intravenous Q12H Oralia Manis, MD   1,250 mg at 07/01/16 4098    Past Medical History:  Diagnosis Date  . HLD (hyperlipidemia)   . Hypertension   . Venous stasis     Past Surgical History:  Procedure Laterality Date  . NO PAST SURGERIES      Social History Social History  Substance Use Topics  . Smoking status: Never Smoker  . Smokeless tobacco: Never Used  . Alcohol use No  Married, wife present as well  Family  History No family history of bleeding disorder, clotting disorders, or autoimmune diseases  No Known Allergies   REVIEW OF SYSTEMS (Negative unless checked)  Constitutional: Weight loss  Fever  Chills Cardiac: Chest pain   Chest pressure   Palpitations   Shortness of breath when laying flat   Shortness of breath at rest   Shortness of breath with exertion. Vascular:  Pain in legs with walking   Pain in legs at rest   Pain in legs when laying flat   Claudication   Pain in feet when walking  Pain in feet at rest  Pain in feet when laying flat   History of DVT   Phlebitis   Swelling in legs   Varicose veins   Non-healing ulcers Pulmonary:   Uses home oxygen   Productive cough   Hemoptysis   Wheeze  COPD   Asthma Neurologic:  Dizziness  Blackouts   Seizures   History of  stroke   History of TIA  Aphasia   Temporary blindness   Dysphagia   Weakness or numbness in arms   Weakness or numbness in legs Musculoskeletal:  Arthritis   Joint swelling   Joint pain   Low back pain Hematologic:  Easy bruising  Easy bleeding   Hypercoagulable state   Anemic  Hepatitis Gastrointestinal:  Blood in stool   Vomiting blood  Gastroesophageal reflux/heartburn   Difficulty swallowing. Genitourinary:  Chronic kidney disease   Difficult urination  Frequent urination  Burning with urination   Blood in urine Skin:  Rashes   Ulcers   Wounds Psychological:  History of anxiety    History of major depression.  Physical Examination  Vitals:   06/30/16 1335 06/30/16 2058 07/01/16 0447 07/01/16 0953  BP: (!) 144/50 (!) 142/51 139/62 (!) 152/57  Pulse: 83 94 92 89  Resp: Temp: 98.2 F (36.8 C) 98 F (36.7 C) 97.9 F (36.6 C)   TempSrc: Oral Oral Oral   SpO2: 98% 97% 98% 97%  Weight:      Height:       Body mass index is 45.01 kg/m. Gen:  WD/WN, obese, NAD Head: [place]/AT, No temporalis wasting. Prominent temp pulse not noted. Ear/Nose/Throat: Hearing grossly intact, nares w/o erythema or drainage, oropharynx w/o Erythema/Exudate Eyes: PERRLA, EOMI.  Neck: Supple, no nuchal rigidity.  NoJVD.  Pulmonary:  Good air movement, equal bilaterally.  Cardiac: RRR, normal S1, S2 Vascular:  Vessel Right Left  Radial Palpable Palpable  Ulnar Palpable Palpable  Brachial Palpable Palpable  Carotid Palpable, without bruit Palpable, without bruit  Aorta Not palpable N/A  Femoral Palpable Palpable  Popliteal Palpable Palpable  PT 1+ Palpable Palpable  DP Palpable 1+ Palpable   Gastrointestinal: soft, non-tender/non-distended. No guarding/reflex. No masses, surgical incisions, or scars. Musculoskeletal: M/S 5/5 throughout.  Extremities without ischemic changes.  No deformity or atrophy. 2+ RLE edema, 3+ LLE edema with  ulcerations present. Neurologic: CN 2-12 intact. Pain and light touch intact in extremities.  Symmetrical.  Speech is fluent. Motor exam as listed above. Psychiatric: Judgment intact, Mood & affect appropriate for pt's clinical situation. Dermatologic: The patient has about a 7-8 cm venous stasis ulceration in the left lower medial calf with about 70% fibrinous exudate present. There is mild surrounding erythema. He also has significant blistering on his ankle and his foot although there is not much erythema and this does not appear to be open ulceration at current. Moderate to severe stasis dermatitis is present bilaterally.  Lymph : No Cervical, Axillary, or Inguinal lymphadenopathy.      CBC Lab Results  Component Value Date   WBC 13.1 (H) 06/30/2016   HGB 13.2 06/30/2016   HCT 40.1 06/30/2016   MCV 79.5 (L) 06/30/2016   PLT 267 06/30/2016    BMET    Component Value Date/Time   NA 136 06/30/2016 0557   K 3.9 06/30/2016 0557   CL 102 06/30/2016 0557   CO2 25 06/30/2016 0557   GLUCOSE 140 (H) 06/30/2016 0557   BUN 11 06/30/2016 0557   CREATININE 0.85 06/30/2016 0557   CALCIUM 8.8 (L) 06/30/2016 0557   GFRNONAA >60 06/30/2016 0557   GFRAA >60 06/30/2016 0557   Estimated Creatinine Clearance: 95 mL/min (by C-G formula based on SCr of 0.85 mg/dL).  COAG No results found for: INR, PROTIME  Radiology No results found.    Assessment/Plan 1. Cellulitis LLE with venous stasis ulcer.  Agree with IV ABx transitioning over to oral ABx at discharge which the patient says should be later today.  Should follow up with us in the office for venous duplex and wound recheck. Since his cellulitis is under better control, Unna boots will be a good option for treatment and this will likely take weeks to maybe even months to heal this large ulceration. 2. Venous insufficiency with ulceration. He reports having had an episode about 15 years ago with an ulceration on his leg he may have a  component of lymphedema as well. The mainstays of treatment will be weight loss, elevation, compression therapy and increased exercise. He may benefit from a lymphedema pump, but I would await the reports of his venous duplex before we do that. I will arrange a venous duplex in our office in the next week or 2 and follow-up from there. 3. Hypertension. Stable and outpatient medications. 4. Obesity. Increases the pressure on the legs. Weight loss would be of benefit.   Mccormick Macon, MD  07/01/2016 10:16 AM

## 2016-07-02 LAB — AEROBIC CULTURE  (SUPERFICIAL SPECIMEN)

## 2016-07-02 LAB — AEROBIC CULTURE W GRAM STAIN (SUPERFICIAL SPECIMEN)

## 2016-08-31 ENCOUNTER — Telehealth (INDEPENDENT_AMBULATORY_CARE_PROVIDER_SITE_OTHER): Payer: Self-pay | Admitting: Vascular Surgery

## 2016-08-31 NOTE — Telephone Encounter (Signed)
Routed to Dr. Wyn Quakerew

## 2016-08-31 NOTE — Telephone Encounter (Signed)
States they discharged the patient from home health because his wounds are healed. Wound is closed but still dry. Needs D/C orders for wraps and any suggestions for ointment for dryness.

## 2016-09-03 ENCOUNTER — Telehealth (INDEPENDENT_AMBULATORY_CARE_PROVIDER_SITE_OTHER): Payer: Self-pay

## 2016-09-03 NOTE — Telephone Encounter (Signed)
[facility] nurse Serina Cowper(alisa) called to get unna boots d/c and a alt cream for skin because she said the legs had healed.I spoke with Selena BattenKim and [employer] were d/c and she advise for the patient to have compression stockings (20-30 size),elevate legs and to also use eurcerin lotion for dry skin.

## 2016-10-26 ENCOUNTER — Ambulatory Visit (INDEPENDENT_AMBULATORY_CARE_PROVIDER_SITE_OTHER): Payer: Medicare Other | Admitting: Vascular Surgery

## 2018-11-03 ENCOUNTER — Telehealth (INDEPENDENT_AMBULATORY_CARE_PROVIDER_SITE_OTHER): Payer: Self-pay

## 2018-11-03 NOTE — Telephone Encounter (Signed)
Patient called and stated that he is having lots of leg pain with no pain alleviation from ibuprofen or Tylenol. He would like to know if he can come in to the office to be seen with an ultrasound to see what's going on?  He has only ever seen you in the hospital back in 2017 for cellulitis,. He never did a follow up.

## 2018-11-06 ENCOUNTER — Telehealth (INDEPENDENT_AMBULATORY_CARE_PROVIDER_SITE_OTHER): Payer: Self-pay | Admitting: Vascular Surgery

## 2020-10-28 ENCOUNTER — Emergency Department: Payer: Medicare Other

## 2020-10-28 ENCOUNTER — Encounter: Payer: Self-pay | Admitting: Emergency Medicine

## 2020-10-28 ENCOUNTER — Inpatient Hospital Stay
Admission: EM | Admit: 2020-10-28 | Discharge: 2020-11-26 | DRG: 870 | Disposition: E | Payer: Medicare Other | Source: Ambulatory Visit | Attending: Internal Medicine | Admitting: Internal Medicine

## 2020-10-28 ENCOUNTER — Other Ambulatory Visit: Payer: Self-pay

## 2020-10-28 DIAGNOSIS — I5031 Acute diastolic (congestive) heart failure: Secondary | ICD-10-CM | POA: Diagnosis present

## 2020-10-28 DIAGNOSIS — E785 Hyperlipidemia, unspecified: Secondary | ICD-10-CM | POA: Diagnosis present

## 2020-10-28 DIAGNOSIS — K760 Fatty (change of) liver, not elsewhere classified: Secondary | ICD-10-CM | POA: Diagnosis present

## 2020-10-28 DIAGNOSIS — I110 Hypertensive heart disease with heart failure: Secondary | ICD-10-CM | POA: Diagnosis present

## 2020-10-28 DIAGNOSIS — E669 Obesity, unspecified: Secondary | ICD-10-CM | POA: Diagnosis present

## 2020-10-28 DIAGNOSIS — J159 Unspecified bacterial pneumonia: Secondary | ICD-10-CM | POA: Diagnosis not present

## 2020-10-28 DIAGNOSIS — Z452 Encounter for adjustment and management of vascular access device: Secondary | ICD-10-CM

## 2020-10-28 DIAGNOSIS — N179 Acute kidney failure, unspecified: Secondary | ICD-10-CM

## 2020-10-28 DIAGNOSIS — U071 COVID-19: Secondary | ICD-10-CM

## 2020-10-28 DIAGNOSIS — E66813 Obesity, class 3: Secondary | ICD-10-CM

## 2020-10-28 DIAGNOSIS — E1159 Type 2 diabetes mellitus with other circulatory complications: Secondary | ICD-10-CM | POA: Diagnosis not present

## 2020-10-28 DIAGNOSIS — J80 Acute respiratory distress syndrome: Secondary | ICD-10-CM | POA: Diagnosis present

## 2020-10-28 DIAGNOSIS — J9601 Acute respiratory failure with hypoxia: Secondary | ICD-10-CM

## 2020-10-28 DIAGNOSIS — Z978 Presence of other specified devices: Secondary | ICD-10-CM

## 2020-10-28 DIAGNOSIS — Z789 Other specified health status: Secondary | ICD-10-CM

## 2020-10-28 DIAGNOSIS — I469 Cardiac arrest, cause unspecified: Secondary | ICD-10-CM | POA: Diagnosis not present

## 2020-10-28 DIAGNOSIS — L8992 Pressure ulcer of unspecified site, stage 2: Secondary | ICD-10-CM | POA: Diagnosis not present

## 2020-10-28 DIAGNOSIS — A419 Sepsis, unspecified organism: Secondary | ICD-10-CM | POA: Diagnosis present

## 2020-10-28 DIAGNOSIS — T380X5A Adverse effect of glucocorticoids and synthetic analogues, initial encounter: Secondary | ICD-10-CM | POA: Diagnosis present

## 2020-10-28 DIAGNOSIS — R0902 Hypoxemia: Secondary | ICD-10-CM | POA: Diagnosis present

## 2020-10-28 DIAGNOSIS — Z6841 Body Mass Index (BMI) 40.0 and over, adult: Secondary | ICD-10-CM

## 2020-10-28 DIAGNOSIS — I152 Hypertension secondary to endocrine disorders: Secondary | ICD-10-CM | POA: Diagnosis not present

## 2020-10-28 DIAGNOSIS — Z79899 Other long term (current) drug therapy: Secondary | ICD-10-CM

## 2020-10-28 DIAGNOSIS — Z66 Do not resuscitate: Secondary | ICD-10-CM | POA: Diagnosis not present

## 2020-10-28 DIAGNOSIS — A4189 Other specified sepsis: Principal | ICD-10-CM | POA: Diagnosis present

## 2020-10-28 DIAGNOSIS — R778 Other specified abnormalities of plasma proteins: Secondary | ICD-10-CM

## 2020-10-28 DIAGNOSIS — I878 Other specified disorders of veins: Secondary | ICD-10-CM | POA: Diagnosis present

## 2020-10-28 DIAGNOSIS — E8881 Metabolic syndrome: Secondary | ICD-10-CM | POA: Diagnosis present

## 2020-10-28 DIAGNOSIS — Z4659 Encounter for fitting and adjustment of other gastrointestinal appliance and device: Secondary | ICD-10-CM

## 2020-10-28 DIAGNOSIS — E119 Type 2 diabetes mellitus without complications: Secondary | ICD-10-CM | POA: Diagnosis present

## 2020-10-28 DIAGNOSIS — I10 Essential (primary) hypertension: Secondary | ICD-10-CM | POA: Diagnosis not present

## 2020-10-28 DIAGNOSIS — R6520 Severe sepsis without septic shock: Secondary | ICD-10-CM | POA: Diagnosis not present

## 2020-10-28 DIAGNOSIS — J1282 Pneumonia due to coronavirus disease 2019: Secondary | ICD-10-CM | POA: Diagnosis present

## 2020-10-28 DIAGNOSIS — Z7984 Long term (current) use of oral hypoglycemic drugs: Secondary | ICD-10-CM | POA: Diagnosis not present

## 2020-10-28 DIAGNOSIS — I872 Venous insufficiency (chronic) (peripheral): Secondary | ICD-10-CM | POA: Diagnosis present

## 2020-10-28 DIAGNOSIS — E1169 Type 2 diabetes mellitus with other specified complication: Secondary | ICD-10-CM | POA: Diagnosis not present

## 2020-10-28 DIAGNOSIS — R6521 Severe sepsis with septic shock: Secondary | ICD-10-CM | POA: Diagnosis present

## 2020-10-28 DIAGNOSIS — R7401 Elevation of levels of liver transaminase levels: Secondary | ICD-10-CM

## 2020-10-28 DIAGNOSIS — L8990 Pressure ulcer of unspecified site, unspecified stage: Secondary | ICD-10-CM | POA: Insufficient documentation

## 2020-10-28 DIAGNOSIS — G928 Other toxic encephalopathy: Secondary | ICD-10-CM | POA: Diagnosis not present

## 2020-10-28 DIAGNOSIS — Z515 Encounter for palliative care: Secondary | ICD-10-CM | POA: Diagnosis not present

## 2020-10-28 DIAGNOSIS — R000 Tachycardia, unspecified: Secondary | ICD-10-CM

## 2020-10-28 LAB — CBC WITH DIFFERENTIAL/PLATELET
Abs Immature Granulocytes: 0.05 10*3/uL (ref 0.00–0.07)
Basophils Absolute: 0 10*3/uL (ref 0.0–0.1)
Basophils Relative: 0 %
Eosinophils Absolute: 0 10*3/uL (ref 0.0–0.5)
Eosinophils Relative: 0 %
HCT: 45.2 % (ref 39.0–52.0)
Hemoglobin: 14.5 g/dL (ref 13.0–17.0)
Immature Granulocytes: 1 %
Lymphocytes Relative: 24 %
Lymphs Abs: 2.2 10*3/uL (ref 0.7–4.0)
MCH: 26.8 pg (ref 26.0–34.0)
MCHC: 32.1 g/dL (ref 30.0–36.0)
MCV: 83.4 fL (ref 80.0–100.0)
Monocytes Absolute: 0.4 10*3/uL (ref 0.1–1.0)
Monocytes Relative: 4 %
Neutro Abs: 6.4 10*3/uL (ref 1.7–7.7)
Neutrophils Relative %: 71 %
Platelets: 185 10*3/uL (ref 150–400)
RBC: 5.42 MIL/uL (ref 4.22–5.81)
RDW: 13.8 % (ref 11.5–15.5)
WBC: 9 10*3/uL (ref 4.0–10.5)
nRBC: 0 % (ref 0.0–0.2)

## 2020-10-28 LAB — COMPREHENSIVE METABOLIC PANEL
ALT: 107 U/L — ABNORMAL HIGH (ref 0–44)
AST: 402 U/L — ABNORMAL HIGH (ref 15–41)
Albumin: 3.3 g/dL — ABNORMAL LOW (ref 3.5–5.0)
Alkaline Phosphatase: 47 U/L (ref 38–126)
Anion gap: 15 (ref 5–15)
BUN: 31 mg/dL — ABNORMAL HIGH (ref 8–23)
CO2: 23 mmol/L (ref 22–32)
Calcium: 8.7 mg/dL — ABNORMAL LOW (ref 8.9–10.3)
Chloride: 92 mmol/L — ABNORMAL LOW (ref 98–111)
Creatinine, Ser: 1.55 mg/dL — ABNORMAL HIGH (ref 0.61–1.24)
GFR, Estimated: 46 mL/min — ABNORMAL LOW (ref >60)
Glucose, Bld: 202 mg/dL — ABNORMAL HIGH (ref 70–99)
Potassium: 4.1 mmol/L (ref 3.5–5.1)
Sodium: 130 mmol/L — ABNORMAL LOW (ref 135–145)
Total Bilirubin: 0.8 mg/dL (ref 0.3–1.2)
Total Protein: 8 g/dL (ref 6.5–8.1)

## 2020-10-28 LAB — CBG MONITORING, ED
Glucose-Capillary: 167 mg/dL — ABNORMAL HIGH (ref 70–99)
Glucose-Capillary: 219 mg/dL — ABNORMAL HIGH (ref 70–99)

## 2020-10-28 LAB — RESP PANEL BY RT-PCR (FLU A&B, COVID) ARPGX2
Influenza A by PCR: NEGATIVE
Influenza B by PCR: NEGATIVE
SARS Coronavirus 2 by RT PCR: POSITIVE — AB

## 2020-10-28 LAB — TROPONIN I (HIGH SENSITIVITY)
Troponin I (High Sensitivity): 4059 ng/L (ref <18)
Troponin I (High Sensitivity): 3707 ng/L (ref <18)

## 2020-10-28 LAB — BRAIN NATRIURETIC PEPTIDE: B Natriuretic Peptide: 202.6 pg/mL — ABNORMAL HIGH (ref 0.0–100.0)

## 2020-10-28 LAB — LACTIC ACID, PLASMA: Lactic Acid, Venous: 2.4 mmol/L (ref 0.5–1.9)

## 2020-10-28 LAB — PROCALCITONIN: Procalcitonin: 6 ng/mL

## 2020-10-28 MED ORDER — AMLODIPINE BESYLATE 5 MG PO TABS
5.0000 mg | ORAL_TABLET | Freq: Every day | ORAL | Status: DC
  Administered 2020-10-29 – 2020-10-31 (×3): 5 mg via ORAL
  Filled 2020-10-28 (×3): qty 1

## 2020-10-28 MED ORDER — METOPROLOL SUCCINATE ER 50 MG PO TB24
25.0000 mg | ORAL_TABLET | Freq: Every day | ORAL | Status: DC
  Administered 2020-10-29 – 2020-10-31 (×3): 25 mg via ORAL
  Filled 2020-10-28 (×4): qty 1

## 2020-10-28 MED ORDER — INSULIN ASPART 100 UNIT/ML ~~LOC~~ SOLN
0.0000 [IU] | Freq: Every day | SUBCUTANEOUS | Status: DC
  Administered 2020-10-30: 2 [IU] via SUBCUTANEOUS
  Filled 2020-10-28 (×2): qty 1

## 2020-10-28 MED ORDER — IOHEXOL 350 MG/ML SOLN
75.0000 mL | Freq: Once | INTRAVENOUS | Status: AC | PRN
  Administered 2020-10-28: 75 mL via INTRAVENOUS

## 2020-10-28 MED ORDER — METHYLPREDNISOLONE SODIUM SUCC 125 MG IJ SOLR
1.0000 mg/kg | Freq: Two times a day (BID) | INTRAMUSCULAR | Status: AC
  Administered 2020-10-28 – 2020-10-31 (×6): 101.875 mg via INTRAVENOUS
  Filled 2020-10-28 (×6): qty 2

## 2020-10-28 MED ORDER — PREDNISONE 50 MG PO TABS
50.0000 mg | ORAL_TABLET | Freq: Every day | ORAL | Status: DC
  Filled 2020-10-28: qty 1

## 2020-10-28 MED ORDER — ASPIRIN 81 MG PO CHEW
324.0000 mg | CHEWABLE_TABLET | Freq: Once | ORAL | Status: AC
  Administered 2020-10-28: 324 mg via ORAL
  Filled 2020-10-28: qty 4

## 2020-10-28 MED ORDER — GUAIFENESIN-DM 100-10 MG/5ML PO SYRP: 10.0000 mL | ORAL_SOLUTION | ORAL | Status: DC | PRN

## 2020-10-28 MED ORDER — ATORVASTATIN CALCIUM 10 MG PO TABS
10.0000 mg | ORAL_TABLET | Freq: Every day | ORAL | Status: DC
  Administered 2020-10-29 – 2020-10-31 (×3): 10 mg via ORAL
  Filled 2020-10-28 (×3): qty 1

## 2020-10-28 MED ORDER — ONDANSETRON HCL 4 MG/2ML IJ SOLN
4.0000 mg | Freq: Four times a day (QID) | INTRAMUSCULAR | Status: DC | PRN
  Administered 2020-10-29: 4 mg via INTRAVENOUS
  Filled 2020-10-28: qty 2

## 2020-10-28 MED ORDER — AMLODIPINE-ATORVASTATIN 5-10 MG PO TABS: 1.0000 | ORAL_TABLET | Freq: Every day | ORAL | Status: DC

## 2020-10-28 MED ORDER — ACETAMINOPHEN 325 MG PO TABS: 325.0000 mg | ORAL_TABLET | Freq: Four times a day (QID) | ORAL | Status: DC | PRN

## 2020-10-28 MED ORDER — ONDANSETRON HCL 4 MG PO TABS: 4.0000 mg | ORAL_TABLET | Freq: Four times a day (QID) | ORAL | Status: DC | PRN

## 2020-10-28 MED ORDER — SODIUM CHLORIDE 0.9 % IV SOLN
100.0000 mg | Freq: Every day | INTRAVENOUS | Status: AC
  Administered 2020-10-29 – 2020-11-01 (×4): 100 mg via INTRAVENOUS
  Filled 2020-10-28: qty 20
  Filled 2020-10-28 (×3): qty 100

## 2020-10-28 MED ORDER — SODIUM CHLORIDE 0.9 % IV SOLN
2.0000 g | Freq: Once | INTRAVENOUS | Status: AC
  Administered 2020-10-28: 2 g via INTRAVENOUS
  Filled 2020-10-28: qty 2

## 2020-10-28 MED ORDER — ENOXAPARIN SODIUM 40 MG/0.4ML ~~LOC~~ SOLN: 40.0000 mg | SUBCUTANEOUS | Status: DC

## 2020-10-28 MED ORDER — SODIUM CHLORIDE 0.9 % IV SOLN: INTRAVENOUS | Status: AC

## 2020-10-28 MED ORDER — HEPARIN (PORCINE) 25000 UT/250ML-% IV SOLN
950.0000 [IU]/h | INTRAVENOUS | Status: DC
  Administered 2020-10-28: 1000 [IU]/h via INTRAVENOUS
  Administered 2020-10-30: 950 [IU]/h via INTRAVENOUS
  Filled 2020-10-28 (×3): qty 250

## 2020-10-28 MED ORDER — VANCOMYCIN HCL IN DEXTROSE 1-5 GM/200ML-% IV SOLN
1000.0000 mg | Freq: Once | INTRAVENOUS | Status: AC
  Administered 2020-10-28: 1000 mg via INTRAVENOUS
  Filled 2020-10-28: qty 200

## 2020-10-28 MED ORDER — INSULIN ASPART 100 UNIT/ML ~~LOC~~ SOLN
0.0000 [IU] | Freq: Three times a day (TID) | SUBCUTANEOUS | Status: DC
  Administered 2020-10-29: 7 [IU] via SUBCUTANEOUS
  Administered 2020-10-29: 11 [IU] via SUBCUTANEOUS
  Administered 2020-10-29: 15 [IU] via SUBCUTANEOUS
  Administered 2020-10-30 (×3): 11 [IU] via SUBCUTANEOUS
  Administered 2020-10-31: 15 [IU] via SUBCUTANEOUS
  Administered 2020-10-31: 11 [IU] via SUBCUTANEOUS
  Filled 2020-10-28 (×8): qty 1

## 2020-10-28 MED ORDER — LISINOPRIL 5 MG PO TABS
5.0000 mg | ORAL_TABLET | Freq: Every day | ORAL | Status: DC
  Administered 2020-10-29: 5 mg via ORAL
  Filled 2020-10-28: qty 1

## 2020-10-28 MED ORDER — SODIUM CHLORIDE 0.9 % IV SOLN
200.0000 mg | Freq: Once | INTRAVENOUS | Status: AC
  Administered 2020-10-28: 200 mg via INTRAVENOUS
  Filled 2020-10-28: qty 200

## 2020-10-28 MED ORDER — DEXAMETHASONE SODIUM PHOSPHATE 10 MG/ML IJ SOLN
10.0000 mg | Freq: Once | INTRAMUSCULAR | Status: AC
  Administered 2020-10-28: 10 mg via INTRAVENOUS
  Filled 2020-10-28: qty 1

## 2020-10-28 MED ORDER — HEPARIN BOLUS VIA INFUSION
4000.0000 [IU] | Freq: Once | INTRAVENOUS | Status: AC
  Administered 2020-10-28: 4000 [IU] via INTRAVENOUS
  Filled 2020-10-28: qty 4000

## 2020-10-28 NOTE — Progress Notes (Signed)
ANTICOAGULATION CONSULT NOTE - Initial Consult  Pharmacy Consult for Heparin Drip Indication: chest pain/ACS  No Known Allergies  Patient Measurements: Height: 5\' 3"  (160 cm) Weight: 102.1 kg (225 lb) IBW/kg (Calculated) : 56.9 Heparin Dosing Weight: 80.4 kg  Vital Signs: Temp: 99.5 F (37.5 C) (12/03 1322) BP: 113/63 (12/03 2040) Pulse Rate: 86 (12/03 2040)  Labs: Recent Labs    November 25, 2020 1323  HGB 14.5  HCT 45.2  PLT 185  CREATININE 1.55*  TROPONINIHS 4,059*    Estimated Creatinine Clearance: 43.7 mL/min (A) (by C-G formula based on SCr of 1.55 mg/dL (H)).   Medical History: Past Medical History:  Diagnosis Date  . HLD (hyperlipidemia)   . Hypertension   . Venous stasis    Assessment: Patient is a 74yo male that was sent from PCP for low O2 sats. Found to have Covid with possible pneumonia. Troponin elevated at 4059. Pharmacy consulted for Heparin dosing.  Goal of Therapy:  Heparin level 0.3-0.7 units/ml Monitor platelets by anticoagulation protocol: Yes   Plan:  Give 4000 units bolus x 1 Start heparin infusion at 1000 units/hr Check anti-Xa level in 8 hours and daily while on heparin Continue to monitor H&H and platelets  74yo, PharmD, BCPS 2020-11-25 8:53 PM

## 2020-10-28 NOTE — ED Triage Notes (Signed)
Pt to ED via POV stating that he fell and hurt his arm. Per first nurse pt was brought over from PCP office because his O2 sats were in the 70's. Pt was put on 3 liters of oxygen and his sats came up to 94. Pt states that he does not wear oxygen at home. Pt is talking in complete sentences at this time and is in NAD.

## 2020-10-28 NOTE — ED Notes (Signed)
Date and time results received: November 23, 2020 9:18 PM   Test: Lactic  Critical Value: 2.4  Name of Provider Notified: [facility]

## 2020-10-28 NOTE — H&P (Signed)
History and Physical   Reeder Nippert DXI:[id number] DOB: 02-16-1946 DOA: 11-24-20  PCP: Barbette Reichmann, MD  Outpatient Specialists: none Patient coming from: [facility] PCP  I have personally briefly reviewed patient's old medical records in [facility] EMR.  Chief Concern: hypoxia  HPI: Jon Thomas is a 74 y.o. male with medical history significant for Morbid obesity, hypertension, chronic bilateral lower extremity venous stasis on chronic Augmentin per PCP, presented to his PCP on 11-24-20 for chief concerns of falling at home after tripping on the rug and feeling generalized weakness.  He denies hitting his head and loss of consciousness.  At his PCP office, patient was found to be hypoxic with SPO2 of 88% on room air.  He was placed on 2 L nasal cannula and per documentation his SPO2 increased to 94%.  He was sent to the emergency department for further evaluation.  In the emergency department, patient tested positive for COVID-19 infection.  Patient is not vaccinated for COVID-19 infection.  Patient states that he has been tested for COVID-19 2 times in the past and always tested negative therefore he felt like he did not require Covid vaccination.  At baseline, patient states he is not on oxygen supplementation.  He endorses difficulty getting up due to truncal obesity at baseline.  ROS: no headache, nausea, vomiting, cough, dysphagia, odynophagia, fever, chills, abdominal pain, chest pain, subjective shortness of breath, dysuria, hematuria, diarrhea, blood in his stool, blood in his urine, changes to his lower extremity swelling.  He endorses generalized weakness.  ED Course: Discussed patient with ED provider, patient is hypoxic secondary to COVID-19 infection.  Review of Systems: As per HPI otherwise 10 point review of systems negative.  Assessment/Plan  Principal Problem:   Acute hypoxemic respiratory failure due to COVID-19 [facility]) Active Problems:    HTN (hypertension)   HLD (hyperlipidemia)   Obesity, Class III, BMI 40-49.9 (morbid obesity) (HCC)   Obesity, diabetes, and hypertension syndrome (HCC)   Severe sepsis (HCC)   Abdominal obesity and metabolic syndrome   Chronic venous stasis dermatitis of both lower extremities   Acute hypoxic respiratory failure secondary to COVID-19 infection -Patient is not vaccinated for COVID-19 -At bedside, patient's SPO2 remained borderline at 87 to 90% on 6 L nasal cannula.  At this time we will place patient on nonrebreather.  Orders have been placed for high flow nasal cannula. -Patient will be admitted to progressive cardiac due to high clinical suspicion for desaturation and requiring high flow nasal cannula -ED provider placed orders for remdesivir IV per pharmacy, cefepime, vancomycin, Decadron 10 mg IV once -We will continue remdesivir and steroids with Solu-Medrol 1 mg/kg dosing  Sepsis secondary to COVID-19 infection Severe sepsis in setting of organ dysfunction  -IVF and bolus not started due to patient maintaining appropriate MAP -Lactic acid elevated at 2.4  -Blood cultures x2 ordered and we will  -Pro-Cal elevated at 6 -Continue ABx  Elevated troponin in setting of negative symptoms of chest pain and/or shortness of breath-suspect secondary to COVID-19 infection with hypoxia -We will follow high sensitive troponin and if positive delta, primary team to consider consulting cardiology -Heparin per pharmacy  Acute kidney injury secondary to COVID-19 infection sepsis -Serum creatinine 0.8/GFR 94 on BMP on 09/05/2020 -BMP in the a.m.  Non-insulin-dependent diabetes mellitus-holding home Metformin at this time -Insulin SSI  Hypertension-controlled, patient endorses that he has taken his antihypertensive medications at home already -Resumed home lisinopril 5 mg daily and amlodipine and metoprolol succinate 25 mg  daily to start on 10/29/2020  Bilateral lower extremity venous stasis  with chronic discoloration-Per patient he was bitten by a spider in 2011 and since then his toes, feet, and legs have been the color purple for and black -Per patient this is baseline for him -Dorsalis pedis palpable bilaterally  Suspect metabolic syndrome Suspect OSA  Chart reviewed.  DVT prophylaxis: Heparin GTT Code Status: full code  Diet: Cardiac Family Communication: updated spouse and brother PJ Huettner Disposition Plan: Pending clinical course Consults called: None at this time Admission status: Inpatient, progressive cardiac  Past Medical History:  Diagnosis Date  . HLD (hyperlipidemia)   . Hypertension   . Venous stasis    Past Surgical History:  Procedure Laterality Date  . NO PAST SURGERIES     Social History:  reports that he has never smoked. He has never used smokeless tobacco. He reports that he does not drink alcohol and does not use drugs.  No Known Allergies No family history on file. Family history: Family history reviewed and not pertinent  Prior to Admission medications   Medication Sig Start Date End Date Taking? Authorizing Provider  amLODipine-atorvastatin (CADUET) 5-10 MG tablet Take 1 tablet by mouth daily.    [provider]  amoxicillin-clavulanate (AUGMENTIN) 875-125 MG tablet Take 1 tablet by mouth every 12 (twelve) hours. 06/25/16   [provider]  lisinopril (PRINIVIL,ZESTRIL) 5 MG tablet Take 5 mg by mouth daily.    [provider]  metFORMIN (GLUCOPHAGE) 500 MG tablet Take 1 tablet (500 mg total) by mouth daily with breakfast. 07/01/16   Enedina Finner, MD  metoprolol succinate (TOPROL-XL) 25 MG 24 hr tablet Take 25 mg by mouth daily.    [provider]   Physical Exam: Vitals:   14-Nov-2020 2040 2020-11-14 2100 November 14, 2020 2115 11-14-2020 2121  BP: 113/63 (!) 121/59 (!) 115/56   Pulse: 86 93 88 90  Resp: (!) 24 (!) 22 (!) 23 20  Temp:    100.1 F (37.8 C)  TempSrc:    Oral  SpO2: 100% 99% 97% 97%  Weight:       Height:       Constitutional: appears age appropriate, NAD, calm, comfortable Eyes: PERRL, lids and conjunctivae normal ENMT: Mucous membranes are moist. Posterior pharynx clear of any exudate or lesions. Age-appropriate dentition. Hearing appropriate Neck: normal, supple, no masses, no thyromegaly Respiratory: clear to auscultation bilaterally, no wheezing, no crackles. Normal respiratory effort. No accessory muscle use.  Cardiovascular: Regular rate and rhythm, no murmurs / rubs / gallops. No extremity edema. 2+ pedal pulses. No carotid bruits.  Abdomen: no tenderness, no masses palpated, no hepatosplenomegaly. Bowel sounds positive.  Musculoskeletal: no clubbing / cyanosis. No joint deformity upper and lower extremities. Good ROM, no contractures, no atrophy. Normal muscle tone.  Skin: no rashes, lesions, ulcers. No induration. Bilateral lower extremities, feet, and toes with purple skin is chronic since 2011 Neurologic: Sensation intact. Strength 5/5 in all 4.  Psychiatric: Normal judgment and insight. Alert and oriented x 3. Normal mood.   EKG: Independently reviewed, showing sinus tachycardia with rate of 99, QTc 421  Chest x-ray on Admission: Personally reviewed and I agree with radiologist reading as below.  DG Chest 2 View  Result Date: November 14, 2020 CLINICAL DATA:  Shortness of breath. EXAM: CHEST - 2 VIEW COMPARISON:  None. FINDINGS: The heart, hila, and mediastinum are normal. No pneumothorax. Bilateral patchy pulmonary opacities are identified. IMPRESSION: Bilateral patchy pulmonary opacities are favored to represent a multifocal  infectious process. Edema considered less likely. Electronically Signed   By: Gerome Sam III M.D   On: 2020/11/23 15:32   CT Angio Chest PE W and/or Wo Contrast  Result Date: 11-23-2020 CLINICAL DATA:  Shortness of breath elevated troponin which which EXAM: CT ANGIOGRAPHY CHEST WITH CONTRAST TECHNIQUE: Multidetector CT imaging of the chest was  performed using the standard protocol during bolus administration of intravenous contrast. Multiplanar CT image reconstructions and MIPs were obtained to evaluate the vascular anatomy. CONTRAST:  52mL OMNIPAQUE IOHEXOL 350 MG/ML SOLN COMPARISON:  None. FINDINGS: Cardiovascular: There is a optimal opacification of the pulmonary arteries. There is no central,segmental, or subsegmental filling defects within the pulmonary arteries. The heart is normal in size. No pericardial effusion or thickening. No evidence right heart strain. There is normal three-vessel brachiocephalic anatomy without proximal stenosis. Scattered atherosclerosis is noted. Mediastinum/Nodes: No hilar, mediastinal, or axillary adenopathy. Thyroid gland, trachea, and esophagus demonstrate no significant findings. Lungs/Pleura: Multifocal patchy airspace opacities are seen throughout both lungs, predominantly within both lung bases. No pleural effusion is seen. Upper Abdomen: No acute abnormalities present in the visualized portions of the upper abdomen. Musculoskeletal: No chest wall abnormality. No acute or significant osseous findings. Review of the MIP images confirms the above findings. IMPRESSION: No central, segmental, or subsegmental pulmonary embolism. Multifocal patchy airspace opacities throughout both lungs, likely consistent with multifocal pneumonia. Aortic Atherosclerosis (ICD10-I70.0). Electronically Signed   By: Jonna Clark M.D.   On: 23-Nov-2020 19:22   Labs on Admission: I have personally reviewed following labs  CBC: Recent Labs  Lab 2020-11-23 1323  WBC 9.0  NEUTROABS 6.4  HGB 14.5  HCT 45.2  MCV 83.4  PLT 185   Basic Metabolic Panel: Recent Labs  Lab Nov 23, 2020 1323  NA 130*  K 4.1  CL 92*  CO2 23  GLUCOSE 202*  BUN 31*  CREATININE 1.55*  CALCIUM 8.7*   Liver Function Tests: Recent Labs  Lab November 23, 2020 1323  AST 402*  ALT 107*  ALKPHOS 47  BILITOT 0.8  PROT 8.0  ALBUMIN 3.3*   Critical care    Performed by: Londell Moh, DO Advised by: Londell Moh, DO   Critical care provider statement Critical care time: 45 minutes   Critical care was necessary to treat or prevent imminent or life-threatening deterioration of the following conditions: acute respiratory failure    Critical care time was spent personally by me on the following activities: Discussion with consultants, evaluation of patient's response to treatment, re-evaluation of patient's response to treatment, examination of patient, ordering and performing treatments and interventions, ordering and review of laboratory studies, ordering and reviewing of radiographic studies, pulse oximetry, reevaluation of patient's condition, obtaining history from patient and review of old charts.  Including review of EKG in the emergency department and previous EKG.  Yalitza Teed N Crispin Vogel D.O. [facility]  If 12AM-7AM, please contact overnight-coverage provider If 7AM-7PM, please contact day coverage provider [email or web address]  2020/11/23, 9:28 PM

## 2020-10-28 NOTE — ED Notes (Signed)
Patient at CT scan.

## 2020-10-28 NOTE — Progress Notes (Signed)
Remdesivir - Pharmacy Brief Note   O:  CXR: Bilateral patchy pulmonary opacities are favored to represent a multifocal infectious process SpO2: 93% on 3L [place]   A/P:  Remdesivir 200 mg IVPB once followed by 100 mg IVPB daily x 4 days.   Clovia Cuff, PharmD, BCPS 11/17/2020 7:20 PM

## 2020-10-28 NOTE — ED Notes (Signed)
Patient reports going to PCP today to "see why I fell" yesterday and oxygen levels were found to be low. Patient denies SOB or any pain from fall at this time.

## 2020-10-28 NOTE — ED Provider Notes (Signed)
[facility] Emergency Department Provider Note   ____________________________________________   I have reviewed the triage vital signs and the nursing notes.   HISTORY  Chief Complaint Fall  History limited by: Not Limited   HPI Jon Thomas is a 74 y.o. male who presents to the emergency department today from primary care doctor's office today because of concerns for hypoxia.  The patient himself went to primary care because of concerns for a fall.  He states that he got his foot caught on a rug.  The patient denies any shortness of breath or chest pain.  The patient denies any fevers or feeling ill. While at PCPs office he was noted to be hypoxic.    Records reviewed. Per medical record review patient has a history of HTN.  Past Medical History:  Diagnosis Date  . HLD (hyperlipidemia)   . Hypertension   . Venous stasis     Patient Active Problem List   Diagnosis Date Noted  . Cellulitis 06/29/2016  . HTN (hypertension) 06/29/2016  . HLD (hyperlipidemia) 06/29/2016    Past Surgical History:  Procedure Laterality Date  . NO PAST SURGERIES      Prior to Admission medications   Medication Sig Start Date End Date Taking? Authorizing Provider  amLODipine-atorvastatin (CADUET) 5-10 MG tablet Take 1 tablet by mouth daily.    [provider]  amoxicillin-clavulanate (AUGMENTIN) 875-125 MG tablet Take 1 tablet by mouth every 12 (twelve) hours. 06/25/16   [provider]  lisinopril (PRINIVIL,ZESTRIL) 5 MG tablet Take 5 mg by mouth daily.    [provider]  metFORMIN (GLUCOPHAGE) 500 MG tablet Take 1 tablet (500 mg total) by mouth daily with breakfast. 07/01/16   Enedina Finner, MD  metoprolol succinate (TOPROL-XL) 25 MG 24 hr tablet Take 25 mg by mouth daily.    [provider]    Allergies Patient has no known allergies.  No family history on file.  Social History Social History   Tobacco Use  . Smoking  status: Never Smoker  . Smokeless tobacco: Never Used  Substance Use Topics  . Alcohol use: No  . Drug use: No    Review of Systems Constitutional: No fever/chills Eyes: No visual changes. ENT: No sore throat. Cardiovascular: Denies chest pain. Respiratory: Denies shortness of breath. Gastrointestinal: No abdominal pain.  No nausea, no vomiting.  No diarrhea.   Genitourinary: Negative for dysuria. Musculoskeletal: Positive for back pain. Skin: Negative for rash. Neurological: Negative for headaches, focal weakness or numbness.  ____________________________________________   PHYSICAL EXAM:  VITAL SIGNS: ED Triage Vitals  Enc Vitals Group     BP 05-Nov-2020 1322 132/63     Pulse Rate Nov 05, 2020 1319 (!) 102     Resp 11-05-2020 1319 16     Temp November 05, 2020 1322 99.5 F (37.5 C)     Temp src --      SpO2 11-05-2020 1319 95 %     Weight --      Height --      Head Circumference --      Peak Flow --      Pain Score Nov 05, 2020 1319 0   Constitutional: Alert and oriented.  Eyes: Conjunctivae are normal.  ENT      Head: Normocephalic and atraumatic.      Nose: No congestion/rhinnorhea.      Mouth/Throat: Mucous membranes are moist.      Neck: No stridor. Hematological/Lymphatic/Immunilogical: No cervical lymphadenopathy. Cardiovascular: Normal rate, regular rhythm.  No murmurs,  rubs, or gallops.  Respiratory: Normal respiratory effort without tachypnea nor retractions. Breath sounds are clear and equal bilaterally. No wheezes/rales/rhonchi. Gastrointestinal: Soft and non tender. No rebound. No guarding.  Genitourinary: Deferred Musculoskeletal: Normal range of motion in all extremities. No lower extremity edema. Neurologic:  Normal speech and language. No gross focal neurologic deficits are appreciated.  Skin:  Skin is warm, dry and intact. No rash noted. Psychiatric: Mood and affect are normal. Speech and behavior are normal. Patient exhibits appropriate insight and  judgment.  ____________________________________________    LABS (pertinent positives/negatives)  COVID positive Trop hs 4059 Procalcitonin 6.00 BNP 202.6 CBC wbc 9.0, hgb 14.5, plt 185 CMP na 130, k 4.1, glu 202, cr 1.55  ____________________________________________   EKG  I, Phineas Semen, attending physician, personally viewed and interpreted this EKG  EKG Time: 1313 Rate: 112 Rhythm: sinus tachycardia Axis: left axis deviation Intervals: qtc 436 QRS: narrow ST changes: no st elevation Impression: abnormal ekg  I, Phineas Semen, attending physician, personally viewed and interpreted this EKG  EKG Time: 1703 Rate: 100 Rhythm: sinus tachycardia Axis: left axis devation Intervals: qtc 429 QRS: narrow ST changes: no st elevation Impression: abnormal ekg   ____________________________________________    RADIOLOGY  CT angio PE No PE. Multifocal pneumonia.  CXR Multifocal pneumonia  ____________________________________________   PROCEDURES  Procedures  CRITICAL CARE Performed by: Phineas Semen   Total critical care time: 40 minutes  Critical care time was exclusive of separately billable procedures and treating other patients.  Critical care was necessary to treat or prevent imminent or life-threatening deterioration.  Critical care was time spent personally by me on the following activities: development of treatment plan with patient and/or surrogate as well as nursing, discussions with consultants, evaluation of patient's response to treatment, examination of patient, obtaining history from patient or surrogate, ordering and performing treatments and interventions, ordering and review of laboratory studies, ordering and review of radiographic studies, pulse oximetry and re-evaluation of patient's condition.  ____________________________________________   INITIAL IMPRESSION / ASSESSMENT AND PLAN / ED COURSE  Pertinent labs & imaging  results that were available during my care of the patient were reviewed by me and considered in my medical decision making (see chart for details).   Patient presented to the emergency department today from primary care doctor's office today because of finding of hypoxia.  The patient in fact went to the primary care doctor's office today because of a fall that occurred yesterday.  On exam here patient has no significant complaints.  He does complain of some back pain after the fall but denies any shortness of breath, chest pain.  Denies any fevers or feeling ill.  Patient was kept on supplemental oxygen here in the emergency department.  Chest x-ray did show multifocal pneumonia concerning for possible Covid.  Patient subsequently did test positive for Covid.  Additionally patient had an elevated procalcitonin raising concerns for secondary bacterial infection.  Did check CT angio to investigate for PE given elevation of troponin.  Did not show pulmonary embolism.  Initial EKG and repeat EKG without ST elevation.  Did discuss findings with patient concerning for Covid, infection and cardiac disease.  Will plan on admission.  ____________________________________________   FINAL CLINICAL IMPRESSION(S) / ED DIAGNOSES  Final diagnoses:  COVID-19  Hypoxia  Elevated troponin     Note: This dictation was prepared with Dragon dictation. Any transcriptional errors that result from this process are unintentional     Phineas Semen, MD 11/06/2020 2117

## 2020-10-29 DIAGNOSIS — R6520 Severe sepsis without septic shock: Secondary | ICD-10-CM

## 2020-10-29 DIAGNOSIS — A419 Sepsis, unspecified organism: Secondary | ICD-10-CM

## 2020-10-29 DIAGNOSIS — I10 Essential (primary) hypertension: Secondary | ICD-10-CM

## 2020-10-29 LAB — CBC WITH DIFFERENTIAL/PLATELET
Abs Immature Granulocytes: 0.05 10*3/uL (ref 0.00–0.07)
Basophils Absolute: 0 10*3/uL (ref 0.0–0.1)
Basophils Relative: 0 %
Eosinophils Absolute: 0 10*3/uL (ref 0.0–0.5)
Eosinophils Relative: 0 %
HCT: 43.5 % (ref 39.0–52.0)
Hemoglobin: 14 g/dL (ref 13.0–17.0)
Immature Granulocytes: 1 %
Lymphocytes Relative: 16 %
Lymphs Abs: 1.4 10*3/uL (ref 0.7–4.0)
MCH: 27.2 pg (ref 26.0–34.0)
MCHC: 32.2 g/dL (ref 30.0–36.0)
MCV: 84.5 fL (ref 80.0–100.0)
Monocytes Absolute: 0.2 10*3/uL (ref 0.1–1.0)
Monocytes Relative: 2 %
Neutro Abs: 7.4 10*3/uL (ref 1.7–7.7)
Neutrophils Relative %: 81 %
Platelets: 178 10*3/uL (ref 150–400)
RBC: 5.15 MIL/uL (ref 4.22–5.81)
RDW: 13.8 % (ref 11.5–15.5)
WBC: 9.1 10*3/uL (ref 4.0–10.5)
nRBC: 0 % (ref 0.0–0.2)

## 2020-10-29 LAB — COMPREHENSIVE METABOLIC PANEL
ALT: 110 U/L — ABNORMAL HIGH (ref 0–44)
AST: 403 U/L — ABNORMAL HIGH (ref 15–41)
Albumin: 2.7 g/dL — ABNORMAL LOW (ref 3.5–5.0)
Alkaline Phosphatase: 35 U/L — ABNORMAL LOW (ref 38–126)
Anion gap: 12 (ref 5–15)
BUN: 34 mg/dL — ABNORMAL HIGH (ref 8–23)
CO2: 22 mmol/L (ref 22–32)
Calcium: 7.8 mg/dL — ABNORMAL LOW (ref 8.9–10.3)
Chloride: 97 mmol/L — ABNORMAL LOW (ref 98–111)
Creatinine, Ser: 1.38 mg/dL — ABNORMAL HIGH (ref 0.61–1.24)
GFR, Estimated: 53 mL/min — ABNORMAL LOW (ref >60)
Glucose, Bld: 303 mg/dL — ABNORMAL HIGH (ref 70–99)
Potassium: 4.4 mmol/L (ref 3.5–5.1)
Sodium: 131 mmol/L — ABNORMAL LOW (ref 135–145)
Total Bilirubin: 0.8 mg/dL (ref 0.3–1.2)
Total Protein: 6.9 g/dL (ref 6.5–8.1)

## 2020-10-29 LAB — MRSA PCR SCREENING: MRSA by PCR: NEGATIVE

## 2020-10-29 LAB — LACTATE DEHYDROGENASE: LDH: 579 U/L — ABNORMAL HIGH (ref 98–192)

## 2020-10-29 LAB — HEMOGLOBIN A1C
Hgb A1c MFr Bld: 8.7 % — ABNORMAL HIGH (ref 4.8–5.6)
Mean Plasma Glucose: 202.99 mg/dL

## 2020-10-29 LAB — TROPONIN I (HIGH SENSITIVITY)
Troponin I (High Sensitivity): 3673 ng/L (ref <18)
Troponin I (High Sensitivity): 1540 ng/L (ref <18)

## 2020-10-29 LAB — GLUCOSE, CAPILLARY
Glucose-Capillary: 280 mg/dL — ABNORMAL HIGH (ref 70–99)
Glucose-Capillary: 327 mg/dL — ABNORMAL HIGH (ref 70–99)
Glucose-Capillary: 243 mg/dL — ABNORMAL HIGH (ref 70–99)
Glucose-Capillary: 210 mg/dL — ABNORMAL HIGH (ref 70–99)

## 2020-10-29 LAB — HEPARIN LEVEL (UNFRACTIONATED)
Heparin Unfractionated: 0.66 IU/mL (ref 0.30–0.70)
Heparin Unfractionated: 0.74 IU/mL — ABNORMAL HIGH (ref 0.30–0.70)

## 2020-10-29 LAB — LACTIC ACID, PLASMA: Lactic Acid, Venous: 0.9 mmol/L (ref 0.5–1.9)

## 2020-10-29 LAB — C-REACTIVE PROTEIN: CRP: 11.5 mg/dL — ABNORMAL HIGH (ref <1.0)

## 2020-10-29 MED ORDER — BARICITINIB 2 MG PO TABS
4.0000 mg | ORAL_TABLET | Freq: Every day | ORAL | Status: DC
  Administered 2020-10-29: 4 mg via ORAL
  Filled 2020-10-29: qty 2

## 2020-10-29 MED ORDER — ASCORBIC ACID 500 MG PO TABS
500.0000 mg | ORAL_TABLET | Freq: Every day | ORAL | Status: DC
  Administered 2020-10-29 – 2020-11-01 (×4): 500 mg via ORAL
  Filled 2020-10-29 (×4): qty 1

## 2020-10-29 MED ORDER — IPRATROPIUM-ALBUTEROL 20-100 MCG/ACT IN AERS
1.0000 | INHALATION_SPRAY | Freq: Four times a day (QID) | RESPIRATORY_TRACT | Status: DC
  Administered 2020-10-29 – 2020-10-31 (×8): 1 via RESPIRATORY_TRACT
  Filled 2020-10-29: qty 4

## 2020-10-29 MED ORDER — INSULIN GLARGINE 100 UNIT/ML ~~LOC~~ SOLN: 8.0000 [IU] | Freq: Every day | SUBCUTANEOUS | Status: DC

## 2020-10-29 MED ORDER — ZINC SULFATE 220 (50 ZN) MG PO CAPS
220.0000 mg | ORAL_CAPSULE | Freq: Every day | ORAL | Status: DC
  Administered 2020-10-29 – 2020-11-01 (×4): 220 mg via ORAL
  Filled 2020-10-29 (×4): qty 1

## 2020-10-29 MED ORDER — INSULIN ASPART 100 UNIT/ML ~~LOC~~ SOLN
10.0000 [IU] | Freq: Three times a day (TID) | SUBCUTANEOUS | Status: DC
  Administered 2020-10-29: 10 [IU] via SUBCUTANEOUS
  Filled 2020-10-29: qty 1

## 2020-10-29 MED ORDER — HYDROCOD POLST-CPM POLST ER 10-8 MG/5ML PO SUER
5.0000 mL | Freq: Two times a day (BID) | ORAL | Status: DC | PRN
  Administered 2020-10-29 – 2020-10-30 (×2): 5 mL via ORAL
  Filled 2020-10-29 (×2): qty 5

## 2020-10-29 NOTE — ED Notes (Signed)
Please bring a bed

## 2020-10-29 NOTE — ED Notes (Signed)
RN assisted pt with urinal, pt utilizing call bell appropriately at this time

## 2020-10-29 NOTE — Plan of Care (Signed)

## 2020-10-29 NOTE — Hospital Course (Addendum)
Jon Thomas is a 74 y.o. male with medical history significant for Morbid obesity, hypertension, chronic bilateral lower extremity venous stasis on chronic Augmentin per PCP, presented to his PCP on 11/17/2020 for chief concerns of falling at home after tripping on the rug and feeling generalized weakness.  He was found to be hypoxic with O2 sat of 88% on room air, therefore sent to the ED where he tested positive for COVID-19.  He is unvaccinated but he and wife currently have appointments to get their vaccines.  Progressive oxygen requirements, requiring 50 L/min heated HFNC.  Being treated with remdesivir, IV steroids.

## 2020-10-29 NOTE — Progress Notes (Signed)
ANTICOAGULATION CONSULT NOTE - Initial Consult  Pharmacy Consult for Heparin Drip Indication: chest pain/ACS  No Known Allergies  Patient Measurements: Height: 5\' 3"  (160 cm) Weight: 102 kg (224 lb 13.9 oz) IBW/kg (Calculated) : 56.9 Heparin Dosing Weight: 80.4 kg  Vital Signs: Temp: 97.8 F (36.6 C) (12/04 0619) Temp Source: Oral (12/04 0619) BP: 103/54 (12/04 0619) Pulse Rate: 83 (12/04 0619)  Labs: Recent Labs    2020/11/22 1323 Nov 22, 2020 2043 10/29/20 0540  HGB 14.5  --  14.0  HCT 45.2  --  43.5  PLT 185  --  178  HEPARINUNFRC  --   --  0.66  CREATININE 1.55*  --  1.38*  TROPONINIHS 4,059* 3,707* 3,673*    Estimated Creatinine Clearance: 49 mL/min (A) (by C-G formula based on SCr of 1.38 mg/dL (H)).   Medical History: Past Medical History:  Diagnosis Date  . HLD (hyperlipidemia)   . Hypertension   . Venous stasis    Assessment: Patient is a 74yo male that was sent from PCP for low O2 sats. Found to have Covid with possible pneumonia. Troponin elevated at 4059. Pharmacy consulted for Heparin dosing.  Goal of Therapy:  Heparin level 0.3-0.7 units/ml Monitor platelets by anticoagulation protocol: Yes   Plan:   12/4: HL @ 0540 = 0.66 Will continue pt on current rate and draw confirmation level on 12/4 @ 1400.   Dhilan Brauer D 10/29/2020 6:47 AM

## 2020-10-29 NOTE — Progress Notes (Addendum)
PROGRESS NOTE    Jon Thomas   PTW:[id number]  DOB: 08/20/1946  PCP: Tracie Harrier, MD    DOA: 11/03/2020 LOS: 1   Brief Narrative   Jon Thomas is a 74 y.o. male with medical history significant for Morbid obesity, hypertension, chronic bilateral lower extremity venous stasis on chronic Augmentin per PCP, presented to his PCP on 11/07/2020 for chief concerns of falling at home after tripping on the rug and feeling generalized weakness.  He was found to be hypoxic with O2 sat of 88% on room air, therefore sent to the ED where he tested positive for COVID-19.  He is unvaccinated but he and wife currently have appointments for other vaccines.  Progressive oxygen requirements, requiring 50 L/min heated HFNC.  Being treated with remdesivir, IV steroids.     Assessment & Plan   Principal Problem:   Acute hypoxemic respiratory failure due to COVID-19 [facility]) Active Problems:   HTN (hypertension)   HLD (hyperlipidemia)   Obesity, Class III, BMI 40-49.9 (morbid obesity) (HCC)   Obesity, diabetes, and hypertension syndrome (HCC)   Severe sepsis (HCC)   Abdominal obesity and metabolic syndrome   Chronic venous stasis dermatitis of both lower extremities   Acute respiratory failure with hypoxia secondary to multifocal COVID-19 pneumonia -currently requiring 50 L/min heated HFNC. --Supplemental oxygen to maintain O2 sat greater than 88%, wean as tolerated --Continue remdesivir, Solu-Medrol (LFT elevation discussed with pharmacy, will monitor closely with low threshold to stop remdesivir if worsening or other signs of liver inflammation) --Monitor inflammatory markers daily along with CBC and CMP -Antitussives as needed -Combivent every 6 hour  Severe sepsis secondary to COVID-19 infection -POA as evidenced by tachycardia, tachypnea and identified source of infection.  Lactic acid and AKI reflect organ dysfunction.  Treated per protocol with IV fluids and broad-spectrum  antibiotics in the ED.  Management as above.  Elevated troponin -POA.  Trend: 7001 >> 3707 >> [id number].  Patient without ischemic chest pain, only mild tightness related to above.  No acute ischemic changes on EKG.  We will continue heparin for now and discuss case with cardiology.  Recheck troponin for continued downtrend.  Patient was given full dose aspirin in the ED.  Transaminitis -AST 403, ALT 110 at time of admission.  Alk phos low at 35.  Bilirubin normal.  Unknown if chronic liver disease.  Suspect fatty liver disease due to body habitus.  Discussed remdesivir with pharmacy and okay to continue for now with a low threshold to stop.  Monitor CMP's daily.  Right upper quadrant ultrasound pending.  AKI -secondary to sepsis and COVID-19 infection.  Baseline creatinine 0.8 in October.  On admission, creatinine 1.38.  Monitor BMP.  Renally dose meds and avoid nephrotoxins as much possible.  Steroid-induced hyperglycemia -  Noninsulin-dependent type 2 diabetes -poorly controlled, A1c is 8.7%.  Hold Metformin.  On resistant sliding scale NovoLog.  Added scheduled mealtime NovoLog.  Monitor closely and adjust insulin as needed, blood glucose goal 140-1 80.  Essential hypertension -chronic, stable.  Continue on home amlodipine, metoprolol.  Hold lisinopril due to AKI.  Hyperlipidemia -continue Lipitor  Bilateral lower extremity venous stasis -patient reported on admission that he was bitten by a spider in 2011 and since has had discoloration of his lower extremities and feet.  Patient reports currently at baseline.  Monitor.   Patient BMI: Body mass index is 39.83 kg/m.   DVT prophylaxis: Place TED hose Start: 11/15/2020 1954   Diet:  Diet Orders (From admission,  onward)    Start     Ordered   11/22/2020 1954  Diet Heart Room service appropriate? Yes; Fluid consistency: Thin  Diet effective now       Question Answer Comment  Room service appropriate? Yes   Fluid consistency: Thin      11/12/2020  1955            Code Status: Full Code    Subjective 10/29/20    Patient seen on rounds today.  He is on 50 L/min heated high flow nasal cannula but appears to be feeling well.  He denies any shortness of breath or cough.  Michela Pitcher he been feeling just fine prior to having a fall day before coming in.  He and wife had vaccination appointment scheduled for this coming week.  Patient and his wife talked extensively about his jacket that was lost while in the ER.  They state handed it to his nurse around 8:30 PM last night.  They state the jacket is black with gray inside and that it is very very important that he get it back.   Disposition Plan & Communication   Status is: Inpatient  Remains inpatient appropriate because:IV treatments appropriate due to intensity of illness or inability to take PO   Dispo: The patient is from: Home              Anticipated d/c is to: Home              Anticipated d/c date is: > 3 days              Patient currently is not medically stable to d/c.    Family Communication: spoke to wife by phone this evening    Consults, Procedures, Significant Events   Consultants:   None  Procedures:   None  Antimicrobials:  Anti-infectives (From admission, onward)   Start     Dose/Rate Route Frequency Ordered Stop   10/29/20 1000  remdesivir 100 mg in sodium chloride 0.9 % 100 mL IVPB       "Followed by" Linked Group Details   100 mg 200 mL/hr over 30 Minutes Intravenous Daily 10/26/2020 1918 11/02/20 0959   11/15/2020 1930  remdesivir 200 mg in sodium chloride 0.9% 250 mL IVPB       "Followed by" Linked Group Details   200 mg 580 mL/hr over 30 Minutes Intravenous Once 10/27/2020 1918 11/20/2020 2339   11/15/2020 1915  ceFEPIme (MAXIPIME) 2 g in sodium chloride 0.9 % 100 mL IVPB        2 g 200 mL/hr over 30 Minutes Intravenous  Once 10/27/2020 1911 10/29/2020 2109   11/14/2020 1915  vancomycin (VANCOCIN) IVPB 1000 mg/200 mL premix        1,000 mg 200 mL/hr over  60 Minutes Intravenous  Once 11/11/2020 1911 11/24/2020 2237         Objective   Vitals:   10/29/20 0947 10/29/20 1031 10/29/20 1239 10/29/20 1712  BP: (!) 105/57 (!) 143/57 (!) 123/57 (!) 120/53  Pulse: 83 86 85 87  Resp: 16  18 18   Temp: 98.5 F (36.9 C)  98.5 F (36.9 C) 98.4 F (36.9 C)  TempSrc: Oral  Oral Oral  SpO2: 92%  (!) 88% 91%  Weight:      Height:        Intake/Output Summary (Last 24 hours) at 10/29/2020 1800 Last data filed at 10/29/2020 1500 Gross per 24 hour  Intake 1162.59 ml  Output 700  ml  Net 462.59 ml   Filed Weights   11/18/2020 2033 10/29/20 0200  Weight: 102.1 kg 102 kg    Physical Exam:  General exam: awake, alert, no acute distress, morbidly obese HEENT: Conjunctival injection moist mucus membranes, hearing grossly normal  Respiratory system: Decreased breath sounds, on 50 L/min heated HFNC, mild conversational dyspnea, no accessory muscle use Cardiovascular system: normal S1/S2, RRR, no pedal edema.   Central nervous system: A&O x3. no gross focal neurologic deficits, normal speech Psychiatry: normal mood, congruent affect, judgement and insight appear normal  Labs   Data Reviewed: I have personally reviewed following labs and imaging studies  CBC: Recent Labs  Lab 10/27/2020 1323 10/29/20 0540  WBC 9.0 9.1  NEUTROABS 6.4 7.4  HGB 14.5 14.0  HCT 45.2 43.5  MCV 83.4 84.5  PLT 185 659   Basic Metabolic Panel: Recent Labs  Lab 11/11/2020 1323 10/29/20 0540  NA 130* 131*  K 4.1 4.4  CL 92* 97*  CO2 23 22  GLUCOSE 202* 303*  BUN 31* 34*  CREATININE 1.55* 1.38*  CALCIUM 8.7* 7.8*   GFR: Estimated Creatinine Clearance: 49 mL/min (A) (by C-G formula based on SCr of 1.38 mg/dL (H)). Liver Function Tests: Recent Labs  Lab 11/02/2020 1323 10/29/20 0540  AST 402* 403*  ALT 107* 110*  ALKPHOS 47 35*  BILITOT 0.8 0.8  PROT 8.0 6.9  ALBUMIN 3.3* 2.7*   No results for input(s): LIPASE, AMYLASE in the last 168 hours. No results for  input(s): AMMONIA in the last 168 hours. Coagulation Profile: No results for input(s): INR, PROTIME in the last 168 hours. Cardiac Enzymes: No results for input(s): CKTOTAL, CKMB, CKMBINDEX, TROPONINI in the last 168 hours. BNP (last 3 results) No results for input(s): PROBNP in the last 8760 hours. HbA1C: Recent Labs    11/04/2020 2043  HGBA1C 8.7*   CBG: Recent Labs  Lab 11/22/2020 2112 11/14/2020 2345 10/29/20 0944 10/29/20 1237 10/29/20 1708  GLUCAP 167* 219* 280* 327* 243*   Lipid Profile: No results for input(s): CHOL, HDL, LDLCALC, TRIG, CHOLHDL, LDLDIRECT in the last 72 hours. Thyroid Function Tests: No results for input(s): TSH, T4TOTAL, FREET4, T3FREE, THYROIDAB in the last 72 hours. Anemia Panel: No results for input(s): VITAMINB12, FOLATE, FERRITIN, TIBC, IRON, RETICCTPCT in the last 72 hours. Sepsis Labs: Recent Labs  Lab 11/13/2020 1323 11/10/2020 2043 11/04/2020 2350  PROCALCITON 6.00  --   --   LATICACIDVEN  --  2.4* 0.9    Recent Results (from the past 240 hour(s))  Resp Panel by RT-PCR (Flu A&B, Covid) Nasopharyngeal Swab     Status: Abnormal   Collection Time: 10/30/2020  4:14 PM   Specimen: Nasopharyngeal Swab; Nasopharyngeal(NP) swabs in vial transport medium  Result Value Ref Range Status   SARS Coronavirus 2 by RT PCR POSITIVE (A) NEGATIVE Final    Comment: RESULT CALLED TO, READ BACK BY AND VERIFIED WITH: LINDSEY BLACK AT 1720 10/27/2020.PMF (NOTE) SARS-CoV-2 target nucleic acids are DETECTED.  The SARS-CoV-2 RNA is generally detectable in upper respiratory specimens during the acute phase of infection. Positive results are indicative of the presence of the identified virus, but do not rule out bacterial infection or co-infection with other pathogens not detected by the test. Clinical correlation with patient history and other diagnostic information is necessary to determine patient infection status. The expected result is Negative.  Fact Sheet for  Patients: EntrepreneurPulse.com.au  Fact Sheet for Healthcare Providers: IncredibleEmployment.be  This test is not yet approved  or cleared by the [place] and  has been authorized for detection and/or diagnosis of SARS-CoV-2 by FDA under an Emergency Use Authorization (EUA).  This EUA will remain in effect (meaning this test can be  used) for the duration of  the COVID-19 declaration under Section 564(b)(1) of the Act, 21 U.S.C. section 360bbb-3(b)(1), unless the authorization is terminated or revoked sooner.     Influenza A by PCR NEGATIVE NEGATIVE Final   Influenza B by PCR NEGATIVE NEGATIVE Final    Comment: (NOTE) The Xpert Xpress SARS-CoV-2/FLU/RSV plus assay is intended as an aid in the diagnosis of influenza from Nasopharyngeal swab specimens and should not be used as a sole basis for treatment. Nasal washings and aspirates are unacceptable for Xpert Xpress SARS-CoV-2/FLU/RSV testing.  Fact Sheet for Patients: EntrepreneurPulse.com.au  Fact Sheet for Healthcare Providers: IncredibleEmployment.be  This test is not yet approved or cleared by the [place] FDA and has been authorized for detection and/or diagnosis of SARS-CoV-2 by FDA under an Emergency Use Authorization (EUA). This EUA will remain in effect (meaning this test can be used) for the duration of the COVID-19 declaration under Section 564(b)(1) of the Act, 21 U.S.C. section 360bbb-3(b)(1), unless the authorization is terminated or revoked.  Performed at [facility], [address] [place], [place] [address]   Blood culture (routine x 2)     Status: None (Preliminary result)   Collection Time: 11/15/2020  8:42 PM   Specimen: BLOOD  Result Value Ref Range Status   Specimen Description BLOOD RIGHT FOREARM  Final   Special Requests   Final    BOTTLES DRAWN AEROBIC AND ANAEROBIC Blood Culture adequate volume    Culture   Final    NO GROWTH < 12 HOURS Performed at [facility], [address] [place], [place] [address]    Report Status PENDING  Incomplete  Blood Culture (routine x 2)     Status: None (Preliminary result)   Collection Time: 11/11/2020  8:43 PM   Specimen: BLOOD  Result Value Ref Range Status   Specimen Description BLOOD LEFT FOREARM  Final   Special Requests   Final    BOTTLES DRAWN AEROBIC AND ANAEROBIC Blood Culture results may not be optimal due to an excessive volume of blood received in culture bottles   Culture   Final    NO GROWTH < 12 HOURS Performed at [facility], [address], [place], [place] [address]    Report Status PENDING  Incomplete  MRSA PCR Screening     Status: None   Collection Time: 10/29/20  6:50 AM   Specimen: Nasal Mucosa; Nasopharyngeal  Result Value Ref Range Status   MRSA by PCR NEGATIVE NEGATIVE Final    Comment:        The GeneXpert MRSA Assay (FDA approved for NASAL specimens only), is one component of a comprehensive MRSA colonization surveillance program. It is not intended to diagnose MRSA infection nor to guide or monitor treatment for MRSA infections. Performed at [facility], [address], [place], [place] [address]       Imaging Studies   DG Chest 2 View  Result Date: 11/02/2020 CLINICAL DATA:  Shortness of breath. EXAM: CHEST - 2 VIEW COMPARISON:  None. FINDINGS: The heart, hila, and mediastinum are normal. No pneumothorax. Bilateral patchy pulmonary opacities are identified. IMPRESSION: Bilateral patchy pulmonary opacities are favored to represent a multifocal infectious process. Edema considered less likely. Electronically Signed   By: Shanon Brow  Jimmye Norman III M.D   On: 11/07/2020 15:32   CT Angio Chest PE W and/or Wo Contrast  Result Date: 11/07/2020 CLINICAL DATA:  Shortness of breath elevated troponin which which EXAM: CT ANGIOGRAPHY CHEST WITH CONTRAST TECHNIQUE: Multidetector CT imaging  of the chest was performed using the standard protocol during bolus administration of intravenous contrast. Multiplanar CT image reconstructions and MIPs were obtained to evaluate the vascular anatomy. CONTRAST:  16m OMNIPAQUE IOHEXOL 350 MG/ML SOLN COMPARISON:  None. FINDINGS: Cardiovascular: There is a optimal opacification of the pulmonary arteries. There is no central,segmental, or subsegmental filling defects within the pulmonary arteries. The heart is normal in size. No pericardial effusion or thickening. No evidence right heart strain. There is normal three-vessel brachiocephalic anatomy without proximal stenosis. Scattered atherosclerosis is noted. Mediastinum/Nodes: No hilar, mediastinal, or axillary adenopathy. Thyroid gland, trachea, and esophagus demonstrate no significant findings. Lungs/Pleura: Multifocal patchy airspace opacities are seen throughout both lungs, predominantly within both lung bases. No pleural effusion is seen. Upper Abdomen: No acute abnormalities present in the visualized portions of the upper abdomen. Musculoskeletal: No chest wall abnormality. No acute or significant osseous findings. Review of the MIP images confirms the above findings. IMPRESSION: No central, segmental, or subsegmental pulmonary embolism. Multifocal patchy airspace opacities throughout both lungs, likely consistent with multifocal pneumonia. Aortic Atherosclerosis (ICD10-I70.0). Electronically Signed   By: BPrudencio PairM.D.   On: 11/10/2020 19:22     Medications   Scheduled Meds: . amLODipine  5 mg Oral Daily   And  . atorvastatin  10 mg Oral Daily  . vitamin C  500 mg Oral Daily  . insulin aspart  0-20 Units Subcutaneous TID WC  . insulin aspart  0-5 Units Subcutaneous QHS  . insulin aspart  10 Units Subcutaneous TID WC  . Ipratropium-Albuterol  1 puff Inhalation Q6H  . methylPREDNISolone (SOLU-MEDROL) injection  1 mg/kg Intravenous Q12H   Followed by  . [START ON 11/01/2020] predniSONE  50 mg  Oral Daily  . metoprolol succinate  25 mg Oral Daily  . zinc sulfate  220 mg Oral Daily   Continuous Infusions: . heparin 850 Units/hr (10/29/20 1716)  . remdesivir 100 mg in NS 100 mL 100 mg (10/29/20 1032)       LOS: 1 day    Time spent: 30 minutes with greater than 50% spent in coordination of care and direct patient contact    KEzekiel Slocumb DO [facility]  10/29/2020, 6:00 PM    If 7PM-7AM, please contact night-coverage. How to contact the T[facility] HospitalAttending or Consulting provider [place] covering provider during after hours [place] for this patient?    1. Check the care team in [facility] look for a) attending/consulting TRH provider listed and b) the TE[facility] Southaventeam listed 2. Log into [email or web address] and use [place]'s universal password to access. If you do not have the password, please contact the hospital operator. 3. Locate the TMart[facility] Institute LLCprovider you are looking for under [facility] and page to a number that you can be directly reached. 4. If you still have difficulty reaching the provider, please page the [facility](Director on Call) for the Hospitalists listed on amion for assistance.

## 2020-10-29 NOTE — Progress Notes (Signed)
Pt received to 2A, room 256, from ED.  Pt oriented to room and call bell.  Pt denies pain or distress at this time.  NSR on the monitor.  See admit data base, assessment and vs's for further details.

## 2020-10-29 NOTE — ED Notes (Signed)
Pt escorted to floor on non-rebreather attached to tele, bp, and O2 sat monitor with RT and RN at bedside for transport. Tolerated well. Denies concerns. RT and receiving RN at pt bedside in inpatient room.

## 2020-10-29 NOTE — ED Notes (Signed)
Assumed care of pt at 2300, pt alert to RN voice, pt shouting in his sleep. Coherent to RN explanation of what plan of care is, however remains disoriented to situation. HFNC in place, pt tolerating well. Side rails up x2, call bell within reach. Medicated per MAR. Denies needs at this time. Awaiting inpatient ready bed.

## 2020-10-30 ENCOUNTER — Inpatient Hospital Stay: Payer: Medicare Other

## 2020-10-30 LAB — COMPREHENSIVE METABOLIC PANEL
ALT: 115 U/L — ABNORMAL HIGH (ref 0–44)
AST: 338 U/L — ABNORMAL HIGH (ref 15–41)
Albumin: 2.6 g/dL — ABNORMAL LOW (ref 3.5–5.0)
Alkaline Phosphatase: 37 U/L — ABNORMAL LOW (ref 38–126)
Anion gap: 12 (ref 5–15)
BUN: 43 mg/dL — ABNORMAL HIGH (ref 8–23)
CO2: 23 mmol/L (ref 22–32)
Calcium: 8.1 mg/dL — ABNORMAL LOW (ref 8.9–10.3)
Chloride: 99 mmol/L (ref 98–111)
Creatinine, Ser: 1.43 mg/dL — ABNORMAL HIGH (ref 0.61–1.24)
GFR, Estimated: 51 mL/min — ABNORMAL LOW (ref >60)
Glucose, Bld: 278 mg/dL — ABNORMAL HIGH (ref 70–99)
Potassium: 4.7 mmol/L (ref 3.5–5.1)
Sodium: 134 mmol/L — ABNORMAL LOW (ref 135–145)
Total Bilirubin: 0.7 mg/dL (ref 0.3–1.2)
Total Protein: 6.9 g/dL (ref 6.5–8.1)

## 2020-10-30 LAB — TSH: TSH: 1.175 u[IU]/mL (ref 0.350–4.500)

## 2020-10-30 LAB — CBC WITH DIFFERENTIAL/PLATELET
Abs Immature Granulocytes: 0.11 10*3/uL — ABNORMAL HIGH (ref 0.00–0.07)
Basophils Absolute: 0 10*3/uL (ref 0.0–0.1)
Basophils Relative: 0 %
Eosinophils Absolute: 0 10*3/uL (ref 0.0–0.5)
Eosinophils Relative: 0 %
HCT: 47.8 % (ref 39.0–52.0)
Hemoglobin: 15.1 g/dL (ref 13.0–17.0)
Immature Granulocytes: 1 %
Lymphocytes Relative: 19 %
Lymphs Abs: 2.9 10*3/uL (ref 0.7–4.0)
MCH: 26.9 pg (ref 26.0–34.0)
MCHC: 31.6 g/dL (ref 30.0–36.0)
MCV: 85.2 fL (ref 80.0–100.0)
Monocytes Absolute: 0.5 10*3/uL (ref 0.1–1.0)
Monocytes Relative: 3 %
Neutro Abs: 11.6 10*3/uL — ABNORMAL HIGH (ref 1.7–7.7)
Neutrophils Relative %: 77 %
Platelets: 218 10*3/uL (ref 150–400)
RBC: 5.61 MIL/uL (ref 4.22–5.81)
RDW: 13.8 % (ref 11.5–15.5)
WBC: 15.1 10*3/uL — ABNORMAL HIGH (ref 4.0–10.5)
nRBC: 0 % (ref 0.0–0.2)

## 2020-10-30 LAB — BASIC METABOLIC PANEL
Anion gap: 11 (ref 5–15)
BUN: 41 mg/dL — ABNORMAL HIGH (ref 8–23)
CO2: 21 mmol/L — ABNORMAL LOW (ref 22–32)
Calcium: 8 mg/dL — ABNORMAL LOW (ref 8.9–10.3)
Chloride: 100 mmol/L (ref 98–111)
Creatinine, Ser: 1.31 mg/dL — ABNORMAL HIGH (ref 0.61–1.24)
GFR, Estimated: 57 mL/min — ABNORMAL LOW (ref >60)
Glucose, Bld: 247 mg/dL — ABNORMAL HIGH (ref 70–99)
Potassium: 4.6 mmol/L (ref 3.5–5.1)
Sodium: 132 mmol/L — ABNORMAL LOW (ref 135–145)

## 2020-10-30 LAB — GLUCOSE, CAPILLARY
Glucose-Capillary: 259 mg/dL — ABNORMAL HIGH (ref 70–99)
Glucose-Capillary: 262 mg/dL — ABNORMAL HIGH (ref 70–99)
Glucose-Capillary: 273 mg/dL — ABNORMAL HIGH (ref 70–99)
Glucose-Capillary: 238 mg/dL — ABNORMAL HIGH (ref 70–99)

## 2020-10-30 LAB — TROPONIN I (HIGH SENSITIVITY)
Troponin I (High Sensitivity): 1093 ng/L (ref <18)
Troponin I (High Sensitivity): 887 ng/L (ref <18)

## 2020-10-30 LAB — FERRITIN: Ferritin: 1528 ng/mL — ABNORMAL HIGH (ref 24–336)

## 2020-10-30 LAB — MAGNESIUM
Magnesium: 2.4 mg/dL (ref 1.7–2.4)
Magnesium: 2.5 mg/dL — ABNORMAL HIGH (ref 1.7–2.4)

## 2020-10-30 LAB — HEPARIN LEVEL (UNFRACTIONATED)
Heparin Unfractionated: 0.13 IU/mL — ABNORMAL LOW (ref 0.30–0.70)
Heparin Unfractionated: 0.35 IU/mL (ref 0.30–0.70)
Heparin Unfractionated: 0.57 IU/mL (ref 0.30–0.70)

## 2020-10-30 LAB — PHOSPHORUS: Phosphorus: 3.2 mg/dL (ref 2.5–4.6)

## 2020-10-30 LAB — C-REACTIVE PROTEIN: CRP: 7.7 mg/dL — ABNORMAL HIGH (ref <1.0)

## 2020-10-30 LAB — FIBRIN DERIVATIVES D-DIMER (ARMC ONLY): Fibrin derivatives D-dimer (ARMC): 1211.44 ng/mL (FEU) — ABNORMAL HIGH (ref 0.00–499.00)

## 2020-10-30 MED ORDER — SODIUM CHLORIDE 0.9 % IV SOLN
500.0000 mg | INTRAVENOUS | Status: AC
  Administered 2020-10-31 – 2020-11-03 (×5): 500 mg via INTRAVENOUS
  Filled 2020-10-30 (×5): qty 500

## 2020-10-30 MED ORDER — METOPROLOL TARTRATE 5 MG/5ML IV SOLN: 5.0000 mg | Freq: Once | INTRAVENOUS | Status: AC

## 2020-10-30 MED ORDER — SODIUM CHLORIDE 0.9 % IV SOLN
1.0000 g | INTRAVENOUS | Status: DC
  Administered 2020-10-30: 1 g via INTRAVENOUS
  Filled 2020-10-30: qty 1
  Filled 2020-10-30: qty 10

## 2020-10-30 MED ORDER — HEPARIN BOLUS VIA INFUSION
1600.0000 [IU] | Freq: Once | INTRAVENOUS | Status: DC
  Filled 2020-10-30: qty 1600

## 2020-10-30 MED ORDER — METOPROLOL TARTRATE 5 MG/5ML IV SOLN
INTRAVENOUS | Status: AC
  Administered 2020-10-30: 5 mg
  Filled 2020-10-30: qty 5

## 2020-10-30 MED ORDER — INSULIN ASPART 100 UNIT/ML ~~LOC~~ SOLN: 18.0000 [IU] | Freq: Three times a day (TID) | SUBCUTANEOUS | Status: DC

## 2020-10-30 MED ORDER — SODIUM CHLORIDE 0.9 % IV SOLN
INTRAVENOUS | Status: DC | PRN
  Administered 2020-10-30: 500 mL via INTRAVENOUS
  Administered 2020-10-31 (×2): 250 mL via INTRAVENOUS
  Administered 2020-11-06: 03:00:00 500 mL via INTRAVENOUS

## 2020-10-30 NOTE — Progress Notes (Signed)
ANTICOAGULATION CONSULT NOTE - Initial Consult  Pharmacy Consult for Heparin Drip Indication: chest pain/ACS  No Known Allergies  Patient Measurements: Height: 5\' 3"  (160 cm) Weight: 114.8 kg (253 lb) IBW/kg (Calculated) : 56.9 Heparin Dosing Weight: 80.4 kg  Vital Signs: Temp: 97.7 F (36.5 C) (12/05 2000) Temp Source: Oral (12/05 2000) BP: 110/53 (12/05 2000) Pulse Rate: 81 (12/05 2000)  Labs: Recent Labs    2020/11/24 1323 11/24/20 2043 10/29/20 0540 10/29/20 1357 10/29/20 1759 10/30/20 0012 10/30/20 0019 10/30/20 0020 10/30/20 0514 10/30/20 0812 10/30/20 1850  HGB 14.5  --  14.0  --   --   --   --   --  15.1  --   --   HCT 45.2  --  43.5  --   --   --   --   --  47.8  --   --   PLT 185  --  178  --   --   --   --   --  218  --   --   HEPARINUNFRC  --   --  0.66   < >  --  0.35  --   --   --  0.13* 0.57  CREATININE 1.55*  --  1.38*  --   --   --   --  1.31* 1.43*  --   --   TROPONINIHS 4,059*   < > 3,673*  --  1,540*  --  1,093*  --  887*  --   --    < > = values in this interval not displayed.    Estimated Creatinine Clearance: 50.6 mL/min (A) (by C-G formula based on SCr of 1.43 mg/dL (H)).   Medical History: Past Medical History:  Diagnosis Date  . HLD (hyperlipidemia)   . Hypertension   . Venous stasis    Assessment: Patient is a 74yo male that was sent from PCP for low O2 sats. Found to have Covid with possible pneumonia. Troponin elevated at 4059. Pharmacy consulted for Heparin dosing.  Goal of Therapy:  Heparin level 0.3-0.7 units/ml Monitor platelets by anticoagulation protocol: Yes   Plan:   12/4: HL @ 0540 = 0.66 12/4: HL @ 1357 = 0.74, supratherapeutic.  Rate decreased to 850 units/hr. 12/5: HL @ 0012 = 0.35, therapeutic X 1  12/5: HL @ 0812 = 0.13, bolus 1600 units, rate increased to 950 units/hr 12/5: HL @ 1850 = 0.57, therapeutic x 1  Will continue pt on current rate and draw confirmation level on 12/6 @ 0300. Daily CBC while on  Heparin drip.  14/6, PharmD, BCPS 10/30/2020 8:35 PM

## 2020-10-30 NOTE — Progress Notes (Signed)
Pt is back in NSR at 85.

## 2020-10-30 NOTE — Progress Notes (Signed)
ANTICOAGULATION CONSULT NOTE - Initial Consult  Pharmacy Consult for Heparin Drip Indication: chest pain/ACS  No Known Allergies  Patient Measurements: Height: 5\' 3"  (160 cm) Weight: 102 kg (224 lb 13.9 oz) IBW/kg (Calculated) : 56.9 Heparin Dosing Weight: 80.4 kg  Vital Signs: Temp: 97.8 F (36.6 C) (12/05 0037) Temp Source: Oral (12/05 0037) BP: 111/76 (12/05 0037) Pulse Rate: 113 (12/05 0037)  Labs: Recent Labs    11/07/2020 1323 11/11/2020 2043 10/29/20 0540 10/29/20 1357 10/29/20 1759 10/30/20 0012 10/30/20 0019 10/30/20 0020  HGB 14.5  --  14.0  --   --   --   --   --   HCT 45.2  --  43.5  --   --   --   --   --   PLT 185  --  178  --   --   --   --   --   HEPARINUNFRC  --   --  0.66 0.74*  --  0.35  --   --   CREATININE 1.55*  --  1.38*  --   --   --   --  1.31*  TROPONINIHS 4,059*   < > 3,673*  --  1,540*  --  1,093*  --    < > = values in this interval not displayed.    Estimated Creatinine Clearance: 51.6 mL/min (A) (by C-G formula based on SCr of 1.31 mg/dL (H)).   Medical History: Past Medical History:  Diagnosis Date  . HLD (hyperlipidemia)   . Hypertension   . Venous stasis    Assessment: Patient is a 74yo male that was sent from PCP for low O2 sats. Found to have Covid with possible pneumonia. Troponin elevated at 4059. Pharmacy consulted for Heparin dosing.  Goal of Therapy:  Heparin level 0.3-0.7 units/ml Monitor platelets by anticoagulation protocol: Yes   Plan:   12/4: HL @ 0540 = 0.66 12/4: HL @ 1357 = 0.74, supratherapeutic.  Rate decreased to 850 units/hr. 12/5: HL @ 0012 = 0.35, therapeutic X 1   Will continue pt on current rate and draw confirmation level on 12/5 @ 0800.   Nyan Dufresne D 10/30/2020 1:14 AM

## 2020-10-30 NOTE — Progress Notes (Signed)
PROGRESS NOTE    Jon Thomas   AYO:[id number]  DOB: 1946/04/01  PCP: Tracie Harrier, MD    DOA: 11/03/2020 LOS: 2   Brief Narrative   Jon Thomas is a 74 y.o. male with medical history significant for Morbid obesity, hypertension, chronic bilateral lower extremity venous stasis on chronic Augmentin per PCP, presented to his PCP on 11/07/2020 for chief concerns of falling at home after tripping on the rug and feeling generalized weakness.  He was found to be hypoxic with O2 sat of 88% on room air, therefore sent to the ED where he tested positive for COVID-19.  He is unvaccinated but he and wife currently have appointments for other vaccines.  Progressive oxygen requirements, requiring 50 L/min heated HFNC.  Being treated with remdesivir, IV steroids.     Assessment & Plan   Principal Problem:   Acute hypoxemic respiratory failure due to COVID-19 [facility]) Active Problems:   HTN (hypertension)   HLD (hyperlipidemia)   Obesity, Class III, BMI 40-49.9 (morbid obesity) (HCC)   Obesity, diabetes, and hypertension syndrome (HCC)   Severe sepsis (HCC)   Abdominal obesity and metabolic syndrome   Chronic venous stasis dermatitis of both lower extremities   Acute respiratory failure with hypoxia secondary to multifocal COVID-19 pneumonia -currently requiring 50 L/min heated HFNC. --Supplemental oxygen to maintain O2 sat greater than 88%, wean as tolerated --Continue remdesivir, Solu-Medrol (LFT elevation discussed with pharmacy, will monitor closely with low threshold to stop remdesivir if worsening or other signs of liver inflammation) --Monitor inflammatory markers daily along with CBC and CMP -Antitussives as needed -Combivent every 6 hour --Start Rocephin and Zithromax for ?secondary bacterial process --Due to transaminitis unable to use baricitinib despite high CRP  Severe sepsis secondary to COVID-19 infection -POA as evidenced by tachycardia, tachypnea and  identified source of infection.  Lactic acid and AKI reflect organ dysfunction.  Treated per protocol with IV fluids and broad-spectrum antibiotics in the ED.  Management as above.  Elevated troponin -POA.  Trend: 4059 >> 3707 >> [id number].  Patient without ischemic chest pain, only mild tightness related to above.  No acute ischemic changes on EKG.  We will continue heparin for now and discuss case with cardiology.  Recheck troponin for continued downtrend.  Patient was given full dose aspirin in the ED.  Transaminitis - On admission AST 403, ALT 110, Alk phos low at 35.  Bilirubin normal.  Unknown if chronic liver disease.  Suspect fatty liver disease due to body habitus and likely metabolic syndrome.   Discussed remdesivir with pharmacy and okay to continue for now with a low threshold to stop.   Monitor CMP's daily.   Right upper quadrant ultrasound pending.  AKI -secondary to sepsis and COVID-19 infection.  Baseline creatinine 0.8 in October.  On admission, creatinine 1.38.  Monitor BMP.  Renally dose meds and avoid nephrotoxins as much possible.  Steroid-induced hyperglycemia -  Noninsulin-dependent type 2 diabetes -poorly controlled, A1c is 8.7%.  Hold Metformin.  On resistant sliding scale NovoLog.  Added scheduled mealtime NovoLog.  Monitor closely and adjust insulin as needed, blood glucose goal 140-1 80.  Essential hypertension -chronic, stable.  Continue on home amlodipine, metoprolol.  Hold lisinopril due to AKI.  Hyperlipidemia -continue Lipitor  Bilateral lower extremity venous stasis -patient reported on admission that he was bitten by a spider in 2011 and since has had discoloration of his lower extremities and feet.  Patient reports currently at baseline.  Monitor.   Patient BMI: Body  mass index is 44.82 kg/m.   DVT prophylaxis: heparin bolus via infusion 1,600 Units Start: 10/30/20 1045 Place TED hose Start: 11/25/2020 1954   Diet:  Diet Orders (From admission, onward)     Start     Ordered   11/01/2020 1954  Diet Heart Room service appropriate? Yes; Fluid consistency: Thin  Diet effective now       Question Answer Comment  Room service appropriate? Yes   Fluid consistency: Thin      11/06/2020 1955            Code Status: Full Code    Subjective 10/30/20    Patient seen on rounds today.  He denies shortness of breath except when he gets upset.  Expresses frustration over delays in getting black tea.   Disposition Plan & Communication   Status is: Inpatient  Remains inpatient appropriate because:IV treatments appropriate due to intensity of illness or inability to take PO   Dispo: The patient is from: Home              Anticipated d/c is to: Home              Anticipated d/c date is: > 3 days              Patient currently is not medically stable to d/c.    Family Communication: spoke to wife by phone today   Consults, Procedures, Significant Events   Consultants:   None  Procedures:   None  Antimicrobials:  Anti-infectives (From admission, onward)   Start     Dose/Rate Route Frequency Ordered Stop   10/29/20 1000  remdesivir 100 mg in sodium chloride 0.9 % 100 mL IVPB       "Followed by" Linked Group Details   100 mg 200 mL/hr over 30 Minutes Intravenous Daily 11/13/2020 1918 11/02/20 0959   11/13/2020 1930  remdesivir 200 mg in sodium chloride 0.9% 250 mL IVPB       "Followed by" Linked Group Details   200 mg 580 mL/hr over 30 Minutes Intravenous Once 11/20/2020 1918 11/06/2020 2339   11/05/2020 1915  ceFEPIme (MAXIPIME) 2 g in sodium chloride 0.9 % 100 mL IVPB        2 g 200 mL/hr over 30 Minutes Intravenous  Once 11/01/2020 1911 10/27/2020 2109   11/17/2020 1915  vancomycin (VANCOCIN) IVPB 1000 mg/200 mL premix        1,000 mg 200 mL/hr over 60 Minutes Intravenous  Once 11/15/2020 1911 11/04/2020 2237         Objective   Vitals:   10/30/20 0519 10/30/20 1232 10/30/20 1350 10/30/20 1639  BP: (!) 111/48 (!) 114/50  (!) 129/48   Pulse: 86 82  80  Resp: 20 18  19   Temp: 97.6 F (36.4 C) 97.9 F (36.6 C)    TempSrc: Axillary Oral    SpO2: (!) 88% (!) 88% 90% 91%  Weight: 114.8 kg     Height:        Intake/Output Summary (Last 24 hours) at 10/30/2020 1922 Last data filed at 10/30/2020 0524 Gross per 24 hour  Intake --  Output 501 ml  Net -501 ml   Filed Weights   11/05/2020 2033 10/29/20 0200 10/30/20 0519  Weight: 102.1 kg 102 kg 114.8 kg    Physical Exam:  General exam: awake, alert, no acute distress, morbidly obese Respiratory system: Decreased breath sounds, on 50 L/min heated HFNC, shallow inspirations, mild conversational dyspnea Cardiovascular system: normal S1/S2,  RRR, no pedal edema.   Gastrointestinal: protuberant abdomen, non-tender Central nervous system: A&O x3. no gross focal neurologic deficits, normal speech Psychiatry: normal mood, congruent affect, judgement and insight appear normal  Labs   Data Reviewed: I have personally reviewed following labs and imaging studies  CBC: Recent Labs  Lab 11/18/2020 1323 10/29/20 0540 10/30/20 0514  WBC 9.0 9.1 15.1*  NEUTROABS 6.4 7.4 11.6*  HGB 14.5 14.0 15.1  HCT 45.2 43.5 47.8  MCV 83.4 84.5 85.2  PLT 185 178 213   Basic Metabolic Panel: Recent Labs  Lab 11/23/2020 1323 10/29/20 0540 10/30/20 0020 10/30/20 0514  NA 130* 131* 132* 134*  K 4.1 4.4 4.6 4.7  CL 92* 97* 100 99  CO2 23 22 21* 23  GLUCOSE 202* 303* 247* 278*  BUN 31* 34* 41* 43*  CREATININE 1.55* 1.38* 1.31* 1.43*  CALCIUM 8.7* 7.8* 8.0* 8.1*  MG  --   --  2.4 2.5*  PHOS  --   --   --  3.2   GFR: Estimated Creatinine Clearance: 50.6 mL/min (A) (by C-G formula based on SCr of 1.43 mg/dL (H)). Liver Function Tests: Recent Labs  Lab 11/23/2020 1323 10/29/20 0540 10/30/20 0514  AST 402* 403* 338*  ALT 107* 110* 115*  ALKPHOS 47 35* 37*  BILITOT 0.8 0.8 0.7  PROT 8.0 6.9 6.9  ALBUMIN 3.3* 2.7* 2.6*   No results for input(s): LIPASE, AMYLASE in the last 168  hours. No results for input(s): AMMONIA in the last 168 hours. Coagulation Profile: No results for input(s): INR, PROTIME in the last 168 hours. Cardiac Enzymes: No results for input(s): CKTOTAL, CKMB, CKMBINDEX, TROPONINI in the last 168 hours. BNP (last 3 results) No results for input(s): PROBNP in the last 8760 hours. HbA1C: Recent Labs    11/22/2020 2043  HGBA1C 8.7*   CBG: Recent Labs  Lab 10/29/20 1708 10/29/20 2148 10/30/20 0829 10/30/20 1231 10/30/20 1639  GLUCAP 243* 210* 259* 262* 273*   Lipid Profile: No results for input(s): CHOL, HDL, LDLCALC, TRIG, CHOLHDL, LDLDIRECT in the last 72 hours. Thyroid Function Tests: Recent Labs    10/30/20 0019  TSH 1.175   Anemia Panel: Recent Labs    10/30/20 0514  FERRITIN 1,528*   Sepsis Labs: Recent Labs  Lab 11/04/2020 1323 11/03/2020 2043 11/09/2020 2350  PROCALCITON 6.00  --   --   LATICACIDVEN  --  2.4* 0.9    Recent Results (from the past 240 hour(s))  Resp Panel by RT-PCR (Flu A&B, Covid) Nasopharyngeal Swab     Status: Abnormal   Collection Time: 11/18/2020  4:14 PM   Specimen: Nasopharyngeal Swab; Nasopharyngeal(NP) swabs in vial transport medium  Result Value Ref Range Status   SARS Coronavirus 2 by RT PCR POSITIVE (A) NEGATIVE Final    Comment: RESULT CALLED TO, READ BACK BY AND VERIFIED WITH: LINDSEY BLACK AT 1720 11/24/2020.PMF (NOTE) SARS-CoV-2 target nucleic acids are DETECTED.  The SARS-CoV-2 RNA is generally detectable in upper respiratory specimens during the acute phase of infection. Positive results are indicative of the presence of the identified virus, but do not rule out bacterial infection or co-infection with other pathogens not detected by the test. Clinical correlation with patient history and other diagnostic information is necessary to determine patient infection status. The expected result is Negative.  Fact Sheet for Patients: EntrepreneurPulse.com.au  Fact Sheet  for Healthcare Providers: IncredibleEmployment.be  This test is not yet approved or cleared by the [place] FDA and  has  been authorized for detection and/or diagnosis of SARS-CoV-2 by FDA under an Emergency Use Authorization (EUA).  This EUA will remain in effect (meaning this test can be  used) for the duration of  the COVID-19 declaration under Section 564(b)(1) of the Act, 21 U.S.C. section 360bbb-3(b)(1), unless the authorization is terminated or revoked sooner.     Influenza A by PCR NEGATIVE NEGATIVE Final   Influenza B by PCR NEGATIVE NEGATIVE Final    Comment: (NOTE) The Xpert Xpress SARS-CoV-2/FLU/RSV plus assay is intended as an aid in the diagnosis of influenza from Nasopharyngeal swab specimens and should not be used as a sole basis for treatment. Nasal washings and aspirates are unacceptable for Xpert Xpress SARS-CoV-2/FLU/RSV testing.  Fact Sheet for Patients: EntrepreneurPulse.com.au  Fact Sheet for Healthcare Providers: IncredibleEmployment.be  This test is not yet approved or cleared by the [place] FDA and has been authorized for detection and/or diagnosis of SARS-CoV-2 by FDA under an Emergency Use Authorization (EUA). This EUA will remain in effect (meaning this test can be used) for the duration of the COVID-19 declaration under Section 564(b)(1) of the Act, 21 U.S.C. section 360bbb-3(b)(1), unless the authorization is terminated or revoked.  Performed at [facility], [address] [place], [place] [address]   Blood culture (routine x 2)     Status: None (Preliminary result)   Collection Time: 11/02/2020  8:42 PM   Specimen: BLOOD  Result Value Ref Range Status   Specimen Description BLOOD RIGHT FOREARM  Final   Special Requests   Final    BOTTLES DRAWN AEROBIC AND ANAEROBIC Blood Culture adequate volume   Culture   Final    NO GROWTH 2 DAYS Performed at [facility], [address], [place], [place] [address]    Report Status PENDING  Incomplete  Blood Culture (routine x 2)     Status: None (Preliminary result)   Collection Time: 11/09/2020  8:43 PM   Specimen: BLOOD  Result Value Ref Range Status   Specimen Description BLOOD LEFT FOREARM  Final   Special Requests   Final    BOTTLES DRAWN AEROBIC AND ANAEROBIC Blood Culture results may not be optimal due to an excessive volume of blood received in culture bottles   Culture   Final    NO GROWTH 2 DAYS Performed at [facility], [address] [place], [place] [address]    Report Status PENDING  Incomplete  MRSA PCR Screening     Status: None   Collection Time: 10/29/20  6:50 AM   Specimen: Nasal Mucosa; Nasopharyngeal  Result Value Ref Range Status   MRSA by PCR NEGATIVE NEGATIVE Final    Comment:        The GeneXpert MRSA Assay (FDA approved for NASAL specimens only), is one component of a comprehensive MRSA colonization surveillance program. It is not intended to diagnose MRSA infection nor to guide or monitor treatment for MRSA infections. Performed at [facility], [address] [place],  [address]       Imaging Studies   DG Chest [place] 1 View  Result Date: 10/30/2020 CLINICAL DATA:  Tachycardia.  COVID positive. EXAM: PORTABLE CHEST 1 VIEW COMPARISON:  October 28, 2020 FINDINGS: There are diffuse bilateral hazy airspace opacities which have significantly worsened since the prior study. There is no pneumothorax or large pleural effusion. The heart size is unchanged. Aortic calcifications are noted. IMPRESSION: Worsening multifocal airspace opacities consistent with the patient's history of viral pneumonia. Electronically  Signed   By: Constance Holster M.D.   On: 10/30/2020 00:44   US Abdomen Limited RUQ (LIVER/GB)  Result Date: 10/30/2020 CLINICAL DATA:  Transaminitis EXAM: ULTRASOUND ABDOMEN LIMITED RIGHT UPPER QUADRANT COMPARISON:  None. FINDINGS:  Gallbladder: There is sludge in the gallbladder with no wall thickening, pericholecystic fluid, stones, or Murphy's sign. Common bile duct: Diameter: 3.5 mm Liver: Increased echogenicity with no focal mass. Portal vein is patent on color Doppler imaging with normal direction of blood flow towards the liver. Other: None. IMPRESSION: 1. Mild sludge in the gallbladder. The gallbladder is otherwise normal in appearance. 2. Probable hepatic steatosis with increased echogenicity in the liver. Electronically Signed   By: Dorise Bullion III M.D   On: 10/30/2020 10:16     Medications   Scheduled Meds: . amLODipine  5 mg Oral Daily   And  . atorvastatin  10 mg Oral Daily  . vitamin C  500 mg Oral Daily  . heparin  1,600 Units Intravenous Once  . insulin aspart  0-20 Units Subcutaneous TID WC  . insulin aspart  0-5 Units Subcutaneous QHS  . insulin aspart  18 Units Subcutaneous TID WC  . Ipratropium-Albuterol  1 puff Inhalation Q6H  . methylPREDNISolone (SOLU-MEDROL) injection  1 mg/kg Intravenous Q12H   Followed by  . [START ON 11/01/2020] predniSONE  50 mg Oral Daily  . metoprolol succinate  25 mg Oral Daily  . zinc sulfate  220 mg Oral Daily   Continuous Infusions: . heparin 950 Units/hr (10/30/20 1012)  . remdesivir 100 mg in NS 100 mL 100 mg (10/30/20 1002)       LOS: 2 days    Time spent: 30 minutes with greater than 50% spent in coordination of care and direct patient contact    Ezekiel Slocumb, DO [facility]  10/30/2020, 7:22 PM    If 7PM-7AM, please contact night-coverage. How to contact the [facility] Attending or Consulting provider [place] or covering provider during after hours [place], for this patient?    1. Check the care team in [facility] and look for a) attending/consulting [facility] provider listed and b) the [facility] team listed 2. Log into [email or web address] and use [place]'s universal password to access. If you do not have the password, please contact the hospital  operator. 3. Locate the [facility] provider you are looking for under [facility] and page to a number that you can be directly reached. 4. If you still have difficulty reaching the provider, please page the [facility] (Director on Call) for the Hospitalists listed on amion for assistance.

## 2020-10-30 NOTE — Progress Notes (Signed)
  Pt in afiv RVR 120's-140's with o documented history.  EKG done.  Manuela Schwartz, NP notifed.  Labs, CXR and IV metoprolol ordered and carried out.  Pt in afib 113 at this time.

## 2020-10-31 ENCOUNTER — Inpatient Hospital Stay: Payer: Medicare Other

## 2020-10-31 DIAGNOSIS — I872 Venous insufficiency (chronic) (peripheral): Secondary | ICD-10-CM

## 2020-10-31 DIAGNOSIS — J9601 Acute respiratory failure with hypoxia: Secondary | ICD-10-CM | POA: Diagnosis not present

## 2020-10-31 DIAGNOSIS — U071 COVID-19: Secondary | ICD-10-CM | POA: Diagnosis not present

## 2020-10-31 DIAGNOSIS — E8881 Metabolic syndrome: Secondary | ICD-10-CM | POA: Diagnosis not present

## 2020-10-31 LAB — CBC WITH DIFFERENTIAL/PLATELET
Abs Immature Granulocytes: 0.14 10*3/uL — ABNORMAL HIGH (ref 0.00–0.07)
Basophils Absolute: 0 10*3/uL (ref 0.0–0.1)
Basophils Relative: 0 %
Eosinophils Absolute: 0 10*3/uL (ref 0.0–0.5)
Eosinophils Relative: 0 %
HCT: 45.7 % (ref 39.0–52.0)
Hemoglobin: 14.5 g/dL (ref 13.0–17.0)
Immature Granulocytes: 1 %
Lymphocytes Relative: 11 %
Lymphs Abs: 1.9 10*3/uL (ref 0.7–4.0)
MCH: 27.1 pg (ref 26.0–34.0)
MCHC: 31.7 g/dL (ref 30.0–36.0)
MCV: 85.4 fL (ref 80.0–100.0)
Monocytes Absolute: 0.8 10*3/uL (ref 0.1–1.0)
Monocytes Relative: 4 %
Neutro Abs: 14.2 10*3/uL — ABNORMAL HIGH (ref 1.7–7.7)
Neutrophils Relative %: 84 %
Platelets: 215 10*3/uL (ref 150–400)
RBC: 5.35 MIL/uL (ref 4.22–5.81)
RDW: 13.9 % (ref 11.5–15.5)
WBC: 17 10*3/uL — ABNORMAL HIGH (ref 4.0–10.5)
nRBC: 0 % (ref 0.0–0.2)

## 2020-10-31 LAB — BLOOD GAS, ARTERIAL
Acid-base deficit: 10.2 mmol/L — ABNORMAL HIGH (ref 0.0–2.0)
Bicarbonate: 21.8 mmol/L (ref 20.0–28.0)
FIO2: 100
MECHVT: 500 mL
O2 Saturation: 95.6 %
PEEP: 10 cmH2O
Patient temperature: 37
RATE: 16 resp/min
pCO2 arterial: 77 mmHg (ref 32.0–48.0)
pH, Arterial: 7.06 — CL (ref 7.350–7.450)
pO2, Arterial: 109 mmHg — ABNORMAL HIGH (ref 83.0–108.0)

## 2020-10-31 LAB — C-REACTIVE PROTEIN: CRP: 3.3 mg/dL — ABNORMAL HIGH (ref <1.0)

## 2020-10-31 LAB — COMPREHENSIVE METABOLIC PANEL
ALT: 106 U/L — ABNORMAL HIGH (ref 0–44)
AST: 218 U/L — ABNORMAL HIGH (ref 15–41)
Albumin: 2.6 g/dL — ABNORMAL LOW (ref 3.5–5.0)
Alkaline Phosphatase: 42 U/L (ref 38–126)
Anion gap: 10 (ref 5–15)
BUN: 46 mg/dL — ABNORMAL HIGH (ref 8–23)
CO2: 23 mmol/L (ref 22–32)
Calcium: 8.1 mg/dL — ABNORMAL LOW (ref 8.9–10.3)
Chloride: 105 mmol/L (ref 98–111)
Creatinine, Ser: 1.24 mg/dL (ref 0.61–1.24)
GFR, Estimated: >60 mL/min (ref >60)
Glucose, Bld: 289 mg/dL — ABNORMAL HIGH (ref 70–99)
Potassium: 4.8 mmol/L (ref 3.5–5.1)
Sodium: 138 mmol/L (ref 135–145)
Total Bilirubin: 0.8 mg/dL (ref 0.3–1.2)
Total Protein: 6.6 g/dL (ref 6.5–8.1)

## 2020-10-31 LAB — TROPONIN I (HIGH SENSITIVITY)
Troponin I (High Sensitivity): 396 ng/L (ref <18)
Troponin I (High Sensitivity): 433 ng/L (ref <18)
Troponin I (High Sensitivity): 439 ng/L (ref <18)

## 2020-10-31 LAB — MRSA PCR SCREENING: MRSA by PCR: NEGATIVE

## 2020-10-31 LAB — PROCALCITONIN: Procalcitonin: 1.13 ng/mL

## 2020-10-31 LAB — FIBRIN DERIVATIVES D-DIMER (ARMC ONLY): Fibrin derivatives D-dimer (ARMC): 832.52 ng/mL (FEU) — ABNORMAL HIGH (ref 0.00–499.00)

## 2020-10-31 LAB — GLUCOSE, CAPILLARY
Glucose-Capillary: 333 mg/dL — ABNORMAL HIGH (ref 70–99)
Glucose-Capillary: 288 mg/dL — ABNORMAL HIGH (ref 70–99)
Glucose-Capillary: 287 mg/dL — ABNORMAL HIGH (ref 70–99)
Glucose-Capillary: 304 mg/dL — ABNORMAL HIGH (ref 70–99)
Glucose-Capillary: 290 mg/dL — ABNORMAL HIGH (ref 70–99)
Glucose-Capillary: 235 mg/dL — ABNORMAL HIGH (ref 70–99)
Glucose-Capillary: 196 mg/dL — ABNORMAL HIGH (ref 70–99)

## 2020-10-31 LAB — MAGNESIUM: Magnesium: 2.7 mg/dL — ABNORMAL HIGH (ref 1.7–2.4)

## 2020-10-31 LAB — PHOSPHORUS: Phosphorus: 2.3 mg/dL — ABNORMAL LOW (ref 2.5–4.6)

## 2020-10-31 LAB — HEPARIN LEVEL (UNFRACTIONATED): Heparin Unfractionated: 0.58 IU/mL (ref 0.30–0.70)

## 2020-10-31 LAB — FERRITIN: Ferritin: 1031 ng/mL — ABNORMAL HIGH (ref 24–336)

## 2020-10-31 MED ORDER — CHLORHEXIDINE GLUCONATE 0.12% ORAL RINSE (MEDLINE KIT)
15.0000 mL | Freq: Two times a day (BID) | OROMUCOSAL | Status: DC
  Administered 2020-10-31 – 2020-11-08 (×16): 15 mL via OROMUCOSAL

## 2020-10-31 MED ORDER — INSULIN GLARGINE 100 UNIT/ML ~~LOC~~ SOLN
10.0000 [IU] | Freq: Every day | SUBCUTANEOUS | Status: DC
  Administered 2020-10-31 – 2020-11-02 (×2): 10 [IU] via SUBCUTANEOUS
  Filled 2020-10-31 (×3): qty 0.1

## 2020-10-31 MED ORDER — MIDODRINE HCL 5 MG PO TABS
10.0000 mg | ORAL_TABLET | Freq: Two times a day (BID) | ORAL | Status: DC
  Administered 2020-10-31 – 2020-11-03 (×6): 10 mg
  Filled 2020-10-31 (×6): qty 2

## 2020-10-31 MED ORDER — ENOXAPARIN SODIUM 60 MG/0.6ML ~~LOC~~ SOLN
0.5000 mg/kg | SUBCUTANEOUS | Status: DC
  Administered 2020-10-31 – 2020-11-01 (×2): 57.5 mg via SUBCUTANEOUS
  Filled 2020-10-31 (×3): qty 0.6

## 2020-10-31 MED ORDER — ORAL CARE MOUTH RINSE
15.0000 mL | OROMUCOSAL | Status: DC
  Administered 2020-10-31 – 2020-11-08 (×77): 15 mL via OROMUCOSAL

## 2020-10-31 MED ORDER — NOREPINEPHRINE 16 MG/250ML-% IV SOLN
0.0000 ug/min | INTRAVENOUS | Status: DC
  Administered 2020-10-31: 8 ug/min via INTRAVENOUS
  Administered 2020-11-02: 5 ug/min via INTRAVENOUS
  Filled 2020-10-31 (×3): qty 250

## 2020-10-31 MED ORDER — DEXMEDETOMIDINE HCL IN NACL 400 MCG/100ML IV SOLN
0.4000 ug/kg/h | INTRAVENOUS | Status: DC
  Filled 2020-10-31: qty 100

## 2020-10-31 MED ORDER — NOREPINEPHRINE 4 MG/250ML-% IV SOLN
2.0000 ug/min | INTRAVENOUS | Status: DC
  Administered 2020-10-31: 4 ug/min via INTRAVENOUS
  Filled 2020-10-31: qty 250

## 2020-10-31 MED ORDER — DOCUSATE SODIUM 50 MG/5ML PO LIQD
100.0000 mg | Freq: Two times a day (BID) | ORAL | Status: DC
  Administered 2020-11-02 – 2020-11-08 (×11): 100 mg
  Filled 2020-10-31 (×12): qty 10

## 2020-10-31 MED ORDER — FENTANYL 2500MCG IN NS 250ML (10MCG/ML) PREMIX INFUSION
25.0000 ug/h | INTRAVENOUS | Status: DC
  Administered 2020-10-31: 25 ug/h via INTRAVENOUS
  Administered 2020-11-01 – 2020-11-06 (×12): 200 ug/h via INTRAVENOUS
  Administered 2020-11-07 (×2): 175 ug/h via INTRAVENOUS
  Filled 2020-10-31 (×15): qty 250

## 2020-10-31 MED ORDER — POLYETHYLENE GLYCOL 3350 17 G PO PACK
17.0000 g | PACK | Freq: Every day | ORAL | Status: DC
  Administered 2020-11-03 – 2020-11-08 (×5): 17 g
  Filled 2020-10-31 (×6): qty 1

## 2020-10-31 MED ORDER — FENTANYL BOLUS VIA INFUSION
25.0000 ug | INTRAVENOUS | Status: DC | PRN
  Administered 2020-11-05 – 2020-11-07 (×2): 25 ug via INTRAVENOUS
  Filled 2020-10-31: qty 25

## 2020-10-31 MED ORDER — FENTANYL CITRATE (PF) 100 MCG/2ML IJ SOLN
25.0000 ug | Freq: Once | INTRAMUSCULAR | Status: AC
  Administered 2020-10-31: 25 ug via INTRAVENOUS
  Filled 2020-10-31: qty 2

## 2020-10-31 MED ORDER — MIDAZOLAM HCL 2 MG/2ML IJ SOLN
1.0000 mg | INTRAMUSCULAR | Status: AC | PRN
  Administered 2020-10-31 (×3): 1 mg via INTRAVENOUS

## 2020-10-31 MED ORDER — SODIUM CHLORIDE 0.9 % IV SOLN
250.0000 mL | INTRAVENOUS | Status: DC
  Administered 2020-10-31 – 2020-11-02 (×2): 250 mL via INTRAVENOUS

## 2020-10-31 MED ORDER — MIDAZOLAM 50MG/50ML (1MG/ML) PREMIX INFUSION
0.5000 mg/h | INTRAVENOUS | Status: DC
  Administered 2020-10-31: 0.5 mg/h via INTRAVENOUS
  Administered 2020-11-01 (×3): 4 mg/h via INTRAVENOUS
  Administered 2020-11-02 – 2020-11-04 (×6): 8 mg/h via INTRAVENOUS
  Administered 2020-11-04: 10 mg/h via INTRAVENOUS
  Administered 2020-11-04: 8 mg/h via INTRAVENOUS
  Administered 2020-11-05 – 2020-11-06 (×9): 10 mg/h via INTRAVENOUS
  Administered 2020-11-07: 12:00:00 6 mg/h via INTRAVENOUS
  Administered 2020-11-07 (×2): 10 mg/h via INTRAVENOUS
  Administered 2020-11-07 – 2020-11-08 (×2): 6 mg/h via INTRAVENOUS
  Filled 2020-10-31 (×30): qty 50

## 2020-10-31 MED ORDER — CHLORHEXIDINE GLUCONATE CLOTH 2 % EX PADS
6.0000 | MEDICATED_PAD | Freq: Every day | CUTANEOUS | Status: DC
  Administered 2020-10-31 – 2020-11-08 (×9): 6 via TOPICAL

## 2020-10-31 MED ORDER — K PHOS MONO-SOD PHOS DI & MONO 155-852-130 MG PO TABS
500.0000 mg | ORAL_TABLET | Freq: Once | ORAL | Status: DC
  Filled 2020-10-31: qty 2

## 2020-10-31 MED ORDER — INSULIN ASPART 100 UNIT/ML ~~LOC~~ SOLN
0.0000 [IU] | SUBCUTANEOUS | Status: DC
  Administered 2020-10-31 (×2): 11 [IU] via SUBCUTANEOUS
  Administered 2020-11-01: 7 [IU] via SUBCUTANEOUS
  Administered 2020-11-01: 4 [IU] via SUBCUTANEOUS
  Administered 2020-11-01 (×4): 7 [IU] via SUBCUTANEOUS
  Administered 2020-11-01: 4 [IU] via SUBCUTANEOUS
  Administered 2020-11-02: 11 [IU] via SUBCUTANEOUS
  Administered 2020-11-02: 7 [IU] via SUBCUTANEOUS
  Administered 2020-11-02: 11 [IU] via SUBCUTANEOUS
  Administered 2020-11-02 (×2): 7 [IU] via SUBCUTANEOUS
  Administered 2020-11-03 (×2): 11 [IU] via SUBCUTANEOUS
  Administered 2020-11-03 (×2): 7 [IU] via SUBCUTANEOUS
  Administered 2020-11-03: 11 [IU] via SUBCUTANEOUS
  Administered 2020-11-03: 4 [IU] via SUBCUTANEOUS
  Administered 2020-11-03: 7 [IU] via SUBCUTANEOUS
  Administered 2020-11-04: 3 [IU] via SUBCUTANEOUS
  Administered 2020-11-04: 7 [IU] via SUBCUTANEOUS
  Administered 2020-11-04: 11 [IU] via SUBCUTANEOUS
  Administered 2020-11-04 (×2): 7 [IU] via SUBCUTANEOUS
  Administered 2020-11-04 – 2020-11-05 (×2): 4 [IU] via SUBCUTANEOUS
  Administered 2020-11-05: 12:00:00 7 [IU] via SUBCUTANEOUS
  Administered 2020-11-05: 20:00:00 3 [IU] via SUBCUTANEOUS
  Administered 2020-11-05: 03:00:00 7 [IU] via SUBCUTANEOUS
  Administered 2020-11-05 – 2020-11-06 (×3): 4 [IU] via SUBCUTANEOUS
  Administered 2020-11-06 (×3): 7 [IU] via SUBCUTANEOUS
  Administered 2020-11-07 (×3): 4 [IU] via SUBCUTANEOUS
  Administered 2020-11-07: 01:00:00 7 [IU] via SUBCUTANEOUS
  Administered 2020-11-07: 11:00:00 3 [IU] via SUBCUTANEOUS
  Administered 2020-11-07 – 2020-11-08 (×4): 4 [IU] via SUBCUTANEOUS
  Administered 2020-11-08: 02:00:00 7 [IU] via SUBCUTANEOUS
  Filled 2020-10-31 (×44): qty 1

## 2020-10-31 MED ORDER — MIDAZOLAM HCL 2 MG/2ML IJ SOLN
1.0000 mg | INTRAMUSCULAR | Status: DC | PRN
  Administered 2020-11-02 – 2020-11-07 (×4): 1 mg via INTRAVENOUS
  Filled 2020-10-31 (×2): qty 2

## 2020-10-31 MED ORDER — VECURONIUM BROMIDE 10 MG IV SOLR
10.0000 mg | INTRAVENOUS | Status: DC | PRN
  Administered 2020-10-31 – 2020-11-07 (×15): 10 mg via INTRAVENOUS
  Filled 2020-10-31 (×15): qty 10

## 2020-10-31 MED ORDER — INSULIN ASPART 100 UNIT/ML ~~LOC~~ SOLN
26.0000 [IU] | Freq: Three times a day (TID) | SUBCUTANEOUS | Status: DC
  Administered 2020-10-31 (×2): 26 [IU] via SUBCUTANEOUS
  Filled 2020-10-31 (×2): qty 1

## 2020-10-31 MED ORDER — SODIUM CHLORIDE 0.9 % IV SOLN
2.0000 g | INTRAVENOUS | Status: AC
  Administered 2020-10-31 – 2020-11-04 (×5): 2 g via INTRAVENOUS
  Filled 2020-10-31 (×5): qty 2

## 2020-10-31 NOTE — Progress Notes (Signed)
Upon arrival to room, Consulting civil engineer and code team at bedside, pt in NSR, intubated at bedside, transferred to CCU room 12, report given bedside to Maralyn Sago, Charity fundraiser.

## 2020-10-31 NOTE — Progress Notes (Signed)
ANTICOAGULATION CONSULT NOTE - Initial Consult  Pharmacy Consult for Heparin Drip Indication: chest pain/ACS  No Known Allergies  Patient Measurements: Height: 5\' 3"  (160 cm) Weight: 113.4 kg (250 lb 1.6 oz) IBW/kg (Calculated) : 56.9 Heparin Dosing Weight: 80.4 kg  Vital Signs: Temp: 98.2 F (36.8 C) (12/06 0305) Temp Source: Oral (12/06 0305) BP: 127/60 (12/06 0305) Pulse Rate: 82 (12/06 0305)  Labs: Recent Labs    10/29/20 0540 10/29/20 0540 10/29/20 1357 10/29/20 1759 10/30/20 0012 10/30/20 0019 10/30/20 0020 10/30/20 0514 10/30/20 0812 10/30/20 1850 10/31/20 0425  HGB 14.0   < >  --   --   --   --   --  15.1  --   --  14.5  HCT 43.5  --   --   --   --   --   --  47.8  --   --  45.7  PLT 178  --   --   --   --   --   --  218  --   --  215  HEPARINUNFRC 0.66  --    < >  --    < >  --   --   --  0.13* 0.57 0.58  CREATININE 1.38*   < >  --   --   --   --  1.31* 1.43*  --   --  1.24  TROPONINIHS 3,673*  --   --  1,540*  --  1,093*  --  887*  --   --   --    < > = values in this interval not displayed.    Estimated Creatinine Clearance: 57.9 mL/min (by C-G formula based on SCr of 1.24 mg/dL).   Medical History: Past Medical History:  Diagnosis Date  . HLD (hyperlipidemia)   . Hypertension   . Venous stasis    Assessment: Patient is a 74yo male that was sent from PCP for low O2 sats. Found to have Covid with possible pneumonia. Troponin elevated at 4059. Pharmacy consulted for Heparin dosing.  Goal of Therapy:  Heparin level 0.3-0.7 units/ml Monitor platelets by anticoagulation protocol: Yes   Plan:   12/4: HL @ 0540 = 0.66 12/4: HL @ 1357 = 0.74, supratherapeutic.  Rate decreased to 850 units/hr. 12/5: HL @ 0012 = 0.35, therapeutic X 1  12/5: HL @ 0812 = 0.13, bolus 1600 units, rate increased to 950 units/hr 12/5: HL @ 1850 = 0.57, therapeutic x 1 12/6: HL @ 0425 = 0.58, therapeutic X 2  Will continue pt on current rate and recheck HL on 12/7 with  AM labs.  Will monitor CBC daily.   Jon Thomas D 10/31/2020 6:23 AM

## 2020-10-31 NOTE — Progress Notes (Addendum)
Pt was asking for his watch but on assessment staff never seen any watch on pt. Will notify incoming shift. Will continue to monitor.

## 2020-10-31 NOTE — Progress Notes (Signed)
GOALS OF CARE DISCUSSION  The Clinical status was relayed to family in detail-Brother over the phone.  Updated and notified of patients medical condition.    patient with increased WOB and using accessory muscles to breathe Patient on vent Explained to family course of therapy and the modalities      Family understands the situation.  Family are satisfied with Plan of action and management. All questions answered, they will come to visit, will discuss goals of care  Additional CC time 32 mins   Jon Thomas Santiago Glad, M.D.  [facility]  Medical Director [facility] Medical Director [facility] Cardio-Pulmonary Department

## 2020-10-31 NOTE — Plan of Care (Signed)
  Problem: Respiratory: Goal: Will maintain a patent airway Outcome: Progressing   Problem: Clinical Measurements: Goal: Will remain free from infection Outcome: Progressing Goal: Cardiovascular complication will be avoided Outcome: Progressing   Problem: Safety: Goal: Ability to remain free from injury will improve Outcome: Progressing   Problem: Respiratory: Goal: Will maintain a patent airway Outcome: Progressing   Problem: Clinical Measurements: Goal: Will remain free from infection Outcome: Progressing Goal: Cardiovascular complication will be avoided Outcome: Progressing   Problem: Safety: Goal: Ability to remain free from injury will improve Outcome: Progressing

## 2020-10-31 NOTE — Progress Notes (Signed)
Pt sats 83-85%, RT called. Per RT, pt is maxxed out on HHF, 55L, ok to try nonrebreather. Pt slid up in bed, HOB elevated, non rebreather  In place, o2 sats up to 89-91%. Will continue to monitor.

## 2020-10-31 NOTE — Consult Note (Signed)
CRITICAL CARE NOTE   CC  Acute Respiratory failure Acute cardiac arrest   HPI  PER REPORT admitted 12/3 74 y.o. male with medical history significant for Morbid obesity, hypertension, chronic bilateral lower extremity venous stasis on chronic Augmentin per PCP, presented to his PCP on 11/21/2020 for chief concerns of falling at home after tripping on the rug and feeling generalized weakness.  He denies hitting his head and loss of consciousness.  At his PCP office, patient was found to be hypoxic with SPO2 of 88% on room air.  He was placed on 2 L nasal cannula and per documentation his SPO2 increased to 94%.  He was sent to the emergency department for further evaluation.  In the emergency department, patient tested positive for COVID-19 infection.  Patient is not vaccinated for COVID-19 infection.  Patient states that he has been tested for COVID-19 2 times in the past and always tested negative therefore he felt like he did not require Covid vaccination.   ED Course: Discussed patient with ED provider, patient is hypoxic secondary to COVID-19 infection.    12/6 admitted to ICU s/p cardiac arrest 12/26 severe resp failure ARDS from COVID now on vent, fio2 at 100%   Active Ambulatory Problems    Diagnosis Date Noted  . Cellulitis 06/29/2016  . HTN (hypertension) 06/29/2016  . HLD (hyperlipidemia) 06/29/2016   Resolved Ambulatory Problems    Diagnosis Date Noted  . No Resolved Ambulatory Problems   Past Medical History:  Diagnosis Date  . Hypertension   . Venous stasis     Past Surgical History:  Procedure Laterality Date  . NO PAST SURGERIES       Social Connections:   . Frequency of Communication with Friends and Family: Not on file  . Frequency of Social Gatherings with Friends and Family: Not on file  . Attends Religious Services: Not on file  . Active Member of Clubs or Organizations: Not on file  . Attends BankerClub or Organization Meetings: Not on file  .  Marital Status: Not on file     BP (!) 143/53   Pulse 93   Temp 98.6 F (37 C) (Oral)   Resp 20   Ht 5\' 3"  (1.6 m)   Wt 113.4 kg   SpO2 91%   BMI 44.30 kg/m    I/O last 3 completed shifts: In: 93.6 [I.V.:93.6] Out: 501 [Urine:500; Stool:1] Total I/O In: -  Out: 250 [Urine:250]  SpO2: 91 % O2 Flow Rate (L/min): 55 L/min FiO2 (%): 100 %  Estimated body mass index is 44.3 kg/m as calculated from the following:   Height as of this encounter: 5\' 3"  (1.6 m).   Weight as of this encounter: 113.4 kg.  SIGNIFICANT EVENTS   REVIEW OF SYSTEMS  PATIENT IS UNABLE TO PROVIDE COMPLETE REVIEW OF SYSTEMS DUE TO SEVERE CRITICAL ILLNESS        PHYSICAL EXAMINATION:  GENERAL:critically ill appearing, +resp distress HEAD: Normocephalic, atraumatic.  EYES: Pupils equal, round, reactive to light.  No scleral icterus.  MOUTH: Moist mucosal membrane. NECK: Supple.  PULMONARY: +rhonchi, +wheezing CARDIOVASCULAR: S1 and S2. Regular rate and rhythm. No murmurs, rubs, or gallops.  GASTROINTESTINAL: Soft, nontender, -distended.  Positive bowel sounds.   MUSCULOSKELETAL: No swelling, clubbing, or edema.  NEUROLOGIC: obtunded, GCS<8 SKIN:intact,warm,dry  MEDICATIONS: I have reviewed all medications and confirmed regimen as documented  CBC    Component Value Date/Time   WBC 17.0 (H) 10/31/2020 0425   RBC 5.35 10/31/2020 0425  HGB 14.5 10/31/2020 0425   HCT 45.7 10/31/2020 0425   PLT 215 10/31/2020 0425   MCV 85.4 10/31/2020 0425   MCH 27.1 10/31/2020 0425   MCHC 31.7 10/31/2020 0425   RDW 13.9 10/31/2020 0425   LYMPHSABS 1.9 10/31/2020 0425   MONOABS 0.8 10/31/2020 0425   EOSABS 0.0 10/31/2020 0425   BASOSABS 0.0 10/31/2020 0425   BMP Latest Ref Rng & Units 10/31/2020 10/30/2020 10/30/2020  Glucose 70 - 99 mg/dL 562(B) 638(L) 373(S)  BUN 8 - 23 mg/dL 28(J) 68(T) 15(B)  Creatinine 0.61 - 1.24 mg/dL 2.62 0.35(D) 9.74(B)  Sodium 135 - 145 mmol/L 138 134(L) 132(L)  Potassium  3.5 - 5.1 mmol/L 4.8 4.7 4.6  Chloride 98 - 111 mmol/L 105 99 100  CO2 22 - 32 mmol/L 23 23 21(L)  Calcium 8.9 - 10.3 mg/dL 8.1(L) 8.1(L) 8.0(L)    CULTURE RESULTS   Recent Results (from the past 240 hour(s))  Resp Panel by RT-PCR (Flu A&B, Covid) Nasopharyngeal Swab     Status: Abnormal   Collection Time: 11/04/2020  4:14 PM   Specimen: Nasopharyngeal Swab; Nasopharyngeal(NP) swabs in vial transport medium  Result Value Ref Range Status   SARS Coronavirus 2 by RT PCR POSITIVE (A) NEGATIVE Final    Comment: RESULT CALLED TO, READ BACK BY AND VERIFIED WITH: LINDSEY BLACK AT 1720 11/04/2020.PMF (NOTE) SARS-CoV-2 target nucleic acids are DETECTED.  The SARS-CoV-2 RNA is generally detectable in upper respiratory specimens during the acute phase of infection. Positive results are indicative of the presence of the identified virus, but do not rule out bacterial infection or co-infection with other pathogens not detected by the test. Clinical correlation with patient history and other diagnostic information is necessary to determine patient infection status. The expected result is Negative.  Fact Sheet for Patients: BloggerCourse.com  Fact Sheet for Healthcare Providers: SeriousBroker.it  This test is not yet approved or cleared by the [place] FDA and  has been authorized for detection and/or diagnosis of SARS-CoV-2 by FDA under an Emergency Use Authorization (EUA).  This EUA will remain in effect (meaning this test can be  used) for the duration of  the COVID-19 declaration under Section 564(b)(1) of the Act, 21 U.S.C. section 360bbb-3(b)(1), unless the authorization is terminated or revoked sooner.     Influenza A by PCR NEGATIVE NEGATIVE Final   Influenza B by PCR NEGATIVE NEGATIVE Final    Comment: (NOTE) The Xpert Xpress SARS-CoV-2/FLU/RSV plus assay is intended as an aid in the diagnosis of influenza from Nasopharyngeal  swab specimens and should not be used as a sole basis for treatment. Nasal washings and aspirates are unacceptable for Xpert Xpress SARS-CoV-2/FLU/RSV testing.  Fact Sheet for Patients: BloggerCourse.com  Fact Sheet for Healthcare Providers: SeriousBroker.it  This test is not yet approved or cleared by the [place] FDA and has been authorized for detection and/or diagnosis of SARS-CoV-2 by FDA under an Emergency Use Authorization (EUA). This EUA will remain in effect (meaning this test can be used) for the duration of the COVID-19 declaration under Section 564(b)(1) of the Act, 21 U.S.C. section 360bbb-3(b)(1), unless the authorization is terminated or revoked.  Performed at [facility], [address] [place], [place] [address]   Blood culture (routine x 2)     Status: None (Preliminary result)   Collection Time: 11/15/2020  8:42 PM   Specimen: BLOOD  Result Value Ref Range Status   Specimen Description BLOOD RIGHT FOREARM  Final   Special Requests  Final    BOTTLES DRAWN AEROBIC AND ANAEROBIC Blood Culture adequate volume   Culture   Final    NO GROWTH 3 DAYS Performed at [facility], [address] [place], [place] [address]    Report Status PENDING  Incomplete  Blood Culture (routine x 2)     Status: None (Preliminary result)   Collection Time: 22-Nov-2020  8:43 PM   Specimen: BLOOD  Result Value Ref Range Status   Specimen Description BLOOD LEFT FOREARM  Final   Special Requests   Final    BOTTLES DRAWN AEROBIC AND ANAEROBIC Blood Culture results may not be optimal due to an excessive volume of blood received in culture bottles   Culture   Final    NO GROWTH 3 DAYS Performed at [facility], [address] [place], [place] [address]    Report Status PENDING  Incomplete  MRSA PCR Screening     Status: None   Collection Time: 10/29/20  6:50 AM   Specimen: Nasal Mucosa;  Nasopharyngeal  Result Value Ref Range Status   MRSA by PCR NEGATIVE NEGATIVE Final    Comment:        The GeneXpert MRSA Assay (FDA approved for NASAL specimens only), is one component of a comprehensive MRSA colonization surveillance program. It is not intended to diagnose MRSA infection nor to guide or monitor treatment for MRSA infections. Performed at [facility], [address] [place], [place] [address]           Indwelling Urinary Catheter continued, requirement due to   Reason to continue Indwelling Urinary Catheter strict Intake/Output monitoring for hemodynamic instability         Ventilator continued, requirement due to severe respiratory failure   Ventilator Sedation RASS 0 to -2      ASSESSMENT AND PLAN SYNOPSIS   Severe ACUTE Hypoxic and Hypercapnic Respiratory Failure due to severe COVID 19 pneumonia and ARDS -continue Full MV support -continue Bronchodilator Therapy -Wean Fio2 and PEEP as tolerated -VAP/VENT bundle implementation   Severe COVID-19 infection, ARDS and pneumonia/pneumonitis Continue IV steroids  IV remdisivir   Acute hypoxemic respiratory failure due to COVID-19 pneumonia / ARDS Mechanical ventilation via ARDS protocol, target PRVC 6 cc/kg Wean PEEP and FiO2 as able Goal plateau pressure less than 30, driving pressure less than 15 Paralytics if necessary for vent synchrony, gas exchange Cycle prone positioning if necessary for oxygenation Deep sedation per PAD protocol, goal RASS -4 Diuresis as blood pressure and renal function can tolerate, goal CVP 5-8.   diuresis as tolerated based on Kidney function VAP prevention order set Remdesivir, plan for 5 days Dexamethasone, plan for 10 days    Morbid obesity, possible OSA.   Will certainly impact respiratory mechanics, ventilator weaning Suspect will need to consider additional PEEP   ACUTE KIDNEY INJURY/Renal Failure -continue Foley Catheter-assess  need -Avoid nephrotoxic agents -Follow urine output, BMP -Ensure adequate renal perfusion, optimize oxygenation -Renal dose medications     NEUROLOGY Acute toxic metabolic encephalopathy, need for sedation Goal RASS -2 to -3  CARDIAC ICU monitoring Check CE Check EKG  ID -continue IV abx as prescibed -follow up cultures  GI GI PROPHYLAXIS as indicated  NUTRITIONAL STATUS Nutrition Status:         DIET-->NPO Constipation protocol as indicated  ENDO - will use ICU hypoglycemic\Hyperglycemia protocol if indicated     ELECTROLYTES -follow labs as needed -replace as needed -pharmacy consultation and following   DVT/GI PRX ordered and  assessed TRANSFUSIONS AS NEEDED MONITOR FSBS I Assessed the need for Labs I Assessed the need for Foley I Assessed the need for Central Venous Line Family Discussion when available I Assessed the need for Mobilization I made an Assessment of medications to be adjusted accordingly Safety Risk assessment completed   CASE DISCUSSED IN MULTIDISCIPLINARY ROUNDS WITH ICU TEAM  Critical Care Time devoted to patient care services described in this note is 75  minutes.   Overall, patient is critically ill, prognosis is guarded.  Patient with Multiorgan failure and at high risk for cardiac arrest and death.    Lucie Leather, M.D.  [facility]  Medical Director [facility] Medical Director [facility] Cardio-Pulmonary Department

## 2020-10-31 NOTE — Procedures (Signed)
Arterial Catheter Insertion Procedure Note  Jon Thomas  [id number]  [birth year]  Date:10/31/20  Time:10:19 PM    Provider Performing: Judithe Modest    Procedure: Insertion of Arterial Line (61470) with US guidance ([id number])   Indication(s) Blood pressure monitoring and/or need for frequent ABGs  Consent Unable to obtain consent due to emergent nature of procedure.  Anesthesia None   Time Out Verified patient identification, verified procedure, site/side was marked, verified correct patient position, special equipment/implants available, medications/allergies/relevant history reviewed, required imaging and test results available.   Sterile Technique Maximal sterile technique including full sterile barrier drape, hand hygiene, sterile gown, sterile gloves, mask, hair covering, sterile ultrasound probe cover (if used).   Procedure Description Area of catheter insertion was cleaned with chlorhexidine and draped in sterile fashion. With real-time ultrasound guidance an arterial catheter was placed into the right femoral artery.  Appropriate arterial tracings confirmed on monitor.     Complications/Tolerance None; patient tolerated the procedure well.   EBL Minimal   Specimen(s) None   BIOPATCH was applied to the insertion site.   Harlon Ditty, AGACNP-BC [facility] Pager: [phone]

## 2020-10-31 NOTE — Progress Notes (Signed)
Pt now hypotensive requiring Levophed (currently at 8 mcg) with limited PIV access.  Will place central line emergently.  Pt also with ARDS with need for frequent ABG's and continuous BP monitoring, will place Arterial line.   Attempt was made to place Left IJ CVC.  Able to visualize IJ on Ultrasound, and was able to cannulate the IJ (IJ deep at approx. 4 cm).  However unable to thread guidewire without resistance, and unable to change needle angle due to depth of vessel (needle almost at the hub).  2 additional attempts to place in left IJ, able to successfully cannulate vessel, but again unable to thread guidewire.  Therefore attempt was aborted.  Pressure was held at the site, no obvious signs of bleeding or complications.  Vital signs remain stable and unchanged. Will obtain follow up CXR to ensure no PTX.   Given unsuccessful attempt at IJ and degree of difficulty, will place Femoral CVC.     Harlon Ditty, AGACNP-BC [facility] Pager: [phone]

## 2020-10-31 NOTE — Progress Notes (Addendum)
PROGRESS NOTE    Jon Thomas   DXA:[id number]  DOB: [birth year]  PCP: Tracie Harrier, MD    DOA: 10/27/2020 LOS: 3   Brief Narrative   Jon Thomas is a 74 y.o. male with medical history significant for Morbid obesity, hypertension, chronic bilateral lower extremity venous stasis on chronic Augmentin per PCP, presented to his PCP on 11/11/2020 for chief concerns of falling at home after tripping on the rug and feeling generalized weakness.  He was found to be hypoxic with O2 sat of 88% on room air, therefore sent to the ED where he tested positive for COVID-19.  He is unvaccinated but he and wife currently have appointments to get their vaccines.  Progressive oxygen requirements, requiring 50 L/min heated HFNC.  Being treated with remdesivir, IV steroids.    Assessment & Plan   Principal Problem:   Acute hypoxemic respiratory failure due to COVID-19 [facility]) Active Problems:   HTN (hypertension)   HLD (hyperlipidemia)   Obesity, Class III, BMI 40-49.9 (morbid obesity) (HCC)   Obesity, diabetes, and hypertension syndrome (HCC)   Severe sepsis (HCC)   Abdominal obesity and metabolic syndrome   Chronic venous stasis dermatitis of both lower extremities   ADDENDUM 12/6 - CODE BLUE called this afternoon after patient seen braiding down on telemetry with heart rate around 30.  Charge nurse on the unit right to the room and found patient's oxygen was off and he was purple with SPO2 in the 30s.  CPR was initiated, ROSC achieved.  Emergently intubated and transferred to ICU.  Patient's brother has been updated and will update wife.   Acute respiratory failure with hypoxia secondary to multifocal COVID-19 pneumonia -currently requiring 55 L/min heated HFNC. 12/5 -slight increased oxygen requirement in the past 24 hours, patient more ill and fatigued appearing --Supplemental oxygen to maintain O2 sat greater than 88%, wean as tolerated --Continue remdesivir, Solu-Medrol (LFT  elevation discussed with pharmacy, will monitor closely with low threshold to stop remdesivir if worsening or other signs of liver inflammation) --Monitor inflammatory markers daily along with CBC and CMP -Antitussives as needed -Combivent every 6 hour --Start Rocephin and Zithromax for ?secondary bacterial process --Due to transaminitis unable to use baricitinib despite high CRP --Lovenox per pharmacy for VTE prophylaxis --Maintain airborne and contact cautions --Incentive spirometry and flutter valve  Severe sepsis secondary to COVID-19 infection -POA as evidenced by tachycardia, tachypnea and identified source of infection.  Lactic acid and AKI reflect organ dysfunction.  Treated per protocol with IV fluids and broad-spectrum antibiotics in the ED.  Management as above.  Elevated troponin -POA.  Trend: 4059 >> 3707 >> [id number].  Patient without ischemic chest pain, only mild tightness related to above.  No acute ischemic changes on EKG.   Heparin DC'd on 12/6 Recheck troponin for continued downtrend.  Patient was given full dose aspirin in the ED.  Transaminitis -improving.  On admission AST 403, ALT 110, Alk phos low at 35.  Bilirubin normal.  Unknown if chronic liver disease.  Suspect fatty liver disease due to body habitus and likely metabolic syndrome.   Discussed remdesivir with pharmacy and okay to continue for now with a low threshold to stop.   Monitor CMP's daily.   Right upper quadrant ultrasound showed gallbladder sludge and hepatic steatosis.  AKI -resolved.  Secondary to sepsis and COVID-19 infection.  Baseline creatinine 0.8 in October.   On admission, creatinine 1.38.  Monitor BMP.  Renally dose meds and avoid nephrotoxins as much possible.  Steroid-induced hyperglycemia -  Noninsulin-dependent type 2 diabetes -poorly controlled, A1c is 8.7%.  Hold Metformin.  On resistant sliding scale NovoLog.  Added scheduled mealtime NovoLog.  Monitor closely and adjust insulin as  needed, blood glucose goal 140-1 80.  Essential hypertension -chronic, stable.  Continue on home amlodipine, metoprolol.  Hold lisinopril due to AKI.  Hyperlipidemia -continue Lipitor  Bilateral lower extremity venous stasis -patient reported on admission that he was bitten by a spider in 2011 and since has had discoloration of his lower extremities and feet.  Patient reports currently at baseline.  Monitor.   Patient BMI: Body mass index is 44.3 kg/m.   DVT prophylaxis: Place TED hose Start: 11/13/2020 1954   Diet:  Diet Orders (From admission, onward)    Start     Ordered   10/31/20 1135  Diet Heart Room service appropriate? Yes; Fluid consistency: Thin  Diet effective now       Comments: Carb modified  Question Answer Comment  Room service appropriate? Yes   Fluid consistency: Thin      10/31/20 1135            Code Status: Full Code    Subjective 10/31/20    Patient seen on rounds today.  He was sleeping, woke up easily, asked that I come back later said he could sleep.  He appears more fatigued today.  He denies feeling short of breath or feeling feverish and having chills.  Denies chest pain.   Disposition Plan & Communication   Status is: Inpatient  Remains inpatient appropriate because:IV treatments appropriate due to intensity of illness or inability to take PO   Dispo: The patient is from: Home              Anticipated d/c is to: Home              Anticipated d/c date is: > 3 days              Patient currently is not medically stable to d/c.    Family Communication: spoke to patient's brother, PJ, by phone today    Consults, Procedures, Significant Events   Consultants:   None  Procedures:   None  Antimicrobials:  Anti-infectives (From admission, onward)   Start     Dose/Rate Route Frequency Ordered Stop   10/31/20 2200  cefTRIAXone (ROCEPHIN) 2 g in sodium chloride 0.9 % 100 mL IVPB        2 g 200 mL/hr over 30 Minutes Intravenous Every  24 hours 10/31/20 0804     10/30/20 2100  cefTRIAXone (ROCEPHIN) 1 g in sodium chloride 0.9 % 100 mL IVPB  Status:  Discontinued        1 g 200 mL/hr over 30 Minutes Intravenous Every 24 hours 10/30/20 1929 10/31/20 0804   10/30/20 2100  azithromycin (ZITHROMAX) 500 mg in sodium chloride 0.9 % 250 mL IVPB        500 mg 250 mL/hr over 60 Minutes Intravenous Every 24 hours 10/30/20 1929     10/29/20 1000  remdesivir 100 mg in sodium chloride 0.9 % 100 mL IVPB       "Followed by" Linked Group Details   100 mg 200 mL/hr over 30 Minutes Intravenous Daily 11/18/2020 1918 11/02/20 0959   11/06/2020 1930  remdesivir 200 mg in sodium chloride 0.9% 250 mL IVPB       "Followed by" Linked Group Details   200 mg 580 mL/hr over 30 Minutes Intravenous Once  11/09/2020 1918 11/05/2020 2339   11/02/2020 1915  ceFEPIme (MAXIPIME) 2 g in sodium chloride 0.9 % 100 mL IVPB        2 g 200 mL/hr over 30 Minutes Intravenous  Once 11/09/2020 1911 11/10/2020 2109   11/17/2020 1915  vancomycin (VANCOCIN) IVPB 1000 mg/200 mL premix        1,000 mg 200 mL/hr over 60 Minutes Intravenous  Once 11/02/2020 1911 11/04/2020 2237         Objective   Vitals:   10/31/20 0800 10/31/20 1245 10/31/20 1254 10/31/20 1315  BP: (!) 141/65 (!) 143/53    Pulse: 90 93    Resp:  20    Temp:  98.6 F (37 C)    TempSrc:  Oral    SpO2:  (!) 88% 90% 91%  Weight:      Height:        Intake/Output Summary (Last 24 hours) at 10/31/2020 1440 Last data filed at 10/31/2020 1257 Gross per 24 hour  Intake 93.55 ml  Output 250 ml  Net -156.45 ml   Filed Weights   10/29/20 0200 10/30/20 0519 10/31/20 0305  Weight: 102 kg 114.8 kg 113.4 kg    Physical Exam:  General exam: sleeping but wakes easily, ill and tired-appearing, no acute distress, morbidly obese Respiratory system: Diminished breath sounds throughout, on 55 L/min heated HFNC, shallow inspirations, normal respiratory effort at rest Cardiovascular system: normal S1/S2, RRR, no pedal  edema.   Central nervous system: A&O x3. no gross focal neurologic deficits, normal speech  Labs   Data Reviewed: I have personally reviewed following labs and imaging studies  CBC: Recent Labs  Lab 11/23/2020 1323 10/29/20 0540 10/30/20 0514 10/31/20 0425  WBC 9.0 9.1 15.1* 17.0*  NEUTROABS 6.4 7.4 11.6* 14.2*  HGB 14.5 14.0 15.1 14.5  HCT 45.2 43.5 47.8 45.7  MCV 83.4 84.5 85.2 85.4  PLT 185 178 218 258   Basic Metabolic Panel: Recent Labs  Lab 11/07/2020 1323 10/29/20 0540 10/30/20 0020 10/30/20 0514 10/31/20 0425  NA 130* 131* 132* 134* 138  K 4.1 4.4 4.6 4.7 4.8  CL 92* 97* 100 99 105  CO2 23 22 21* 23 23  GLUCOSE 202* 303* 247* 278* 289*  BUN 31* 34* 41* 43* 46*  CREATININE 1.55* 1.38* 1.31* 1.43* 1.24  CALCIUM 8.7* 7.8* 8.0* 8.1* 8.1*  MG  --   --  2.4 2.5* 2.7*  PHOS  --   --   --  3.2 2.3*   GFR: Estimated Creatinine Clearance: 57.9 mL/min (by C-G formula based on SCr of 1.24 mg/dL). Liver Function Tests: Recent Labs  Lab 11/13/2020 1323 10/29/20 0540 10/30/20 0514 10/31/20 0425  AST 402* 403* 338* 218*  ALT 107* 110* 115* 106*  ALKPHOS 47 35* 37* 42  BILITOT 0.8 0.8 0.7 0.8  PROT 8.0 6.9 6.9 6.6  ALBUMIN 3.3* 2.7* 2.6* 2.6*   No results for input(s): LIPASE, AMYLASE in the last 168 hours. No results for input(s): AMMONIA in the last 168 hours. Coagulation Profile: No results for input(s): INR, PROTIME in the last 168 hours. Cardiac Enzymes: No results for input(s): CKTOTAL, CKMB, CKMBINDEX, TROPONINI in the last 168 hours. BNP (last 3 results) No results for input(s): PROBNP in the last 8760 hours. HbA1C: Recent Labs    10/30/2020 2043  HGBA1C 8.7*   CBG: Recent Labs  Lab 10/30/20 1639 10/30/20 2207 10/31/20 0843 10/31/20 1241 10/31/20 1413  GLUCAP 273* 238* 333* 288* 287*   Lipid  Profile: No results for input(s): CHOL, HDL, LDLCALC, TRIG, CHOLHDL, LDLDIRECT in the last 72 hours. Thyroid Function Tests: Recent Labs    10/30/20 0019   TSH 1.175   Anemia Panel: Recent Labs    10/30/20 0514 10/31/20 0425  FERRITIN 1,528* 1,031*   Sepsis Labs: Recent Labs  Lab 11/06/2020 1323 11/24/2020 2043 11/06/2020 2350 10/31/20 0425  PROCALCITON 6.00  --   --  1.13  LATICACIDVEN  --  2.4* 0.9  --     Recent Results (from the past 240 hour(s))  Resp Panel by RT-PCR (Flu A&B, Covid) Nasopharyngeal Swab     Status: Abnormal   Collection Time: 11/12/2020  4:14 PM   Specimen: Nasopharyngeal Swab; Nasopharyngeal(NP) swabs in vial transport medium  Result Value Ref Range Status   SARS Coronavirus 2 by RT PCR POSITIVE (A) NEGATIVE Final    Comment: RESULT CALLED TO, READ BACK BY AND VERIFIED WITH: LINDSEY BLACK AT 1720 11/15/2020.PMF (NOTE) SARS-CoV-2 target nucleic acids are DETECTED.  The SARS-CoV-2 RNA is generally detectable in upper respiratory specimens during the acute phase of infection. Positive results are indicative of the presence of the identified virus, but do not rule out bacterial infection or co-infection with other pathogens not detected by the test. Clinical correlation with patient history and other diagnostic information is necessary to determine patient infection status. The expected result is Negative.  Fact Sheet for Patients: EntrepreneurPulse.com.au  Fact Sheet for Healthcare Providers: IncredibleEmployment.be  This test is not yet approved or cleared by the [place] FDA and  has been authorized for detection and/or diagnosis of SARS-CoV-2 by FDA under an Emergency Use Authorization (EUA).  This EUA will remain in effect (meaning this test can be  used) for the duration of  the COVID-19 declaration under Section 564(b)(1) of the Act, 21 U.S.C. section 360bbb-3(b)(1), unless the authorization is terminated or revoked sooner.     Influenza A by PCR NEGATIVE NEGATIVE Final   Influenza B by PCR NEGATIVE NEGATIVE Final    Comment: (NOTE) The Xpert Xpress  SARS-CoV-2/FLU/RSV plus assay is intended as an aid in the diagnosis of influenza from Nasopharyngeal swab specimens and should not be used as a sole basis for treatment. Nasal washings and aspirates are unacceptable for Xpert Xpress SARS-CoV-2/FLU/RSV testing.  Fact Sheet for Patients: EntrepreneurPulse.com.au  Fact Sheet for Healthcare Providers: IncredibleEmployment.be  This test is not yet approved or cleared by the [place] FDA and has been authorized for detection and/or diagnosis of SARS-CoV-2 by FDA under an Emergency Use Authorization (EUA). This EUA will remain in effect (meaning this test can be used) for the duration of the COVID-19 declaration under Section 564(b)(1) of the Act, 21 U.S.C. section 360bbb-3(b)(1), unless the authorization is terminated or revoked.  Performed at [facility], [address] [place], [place] [address]   Blood culture (routine x 2)     Status: None (Preliminary result)   Collection Time: 11/09/2020  8:42 PM   Specimen: BLOOD  Result Value Ref Range Status   Specimen Description BLOOD RIGHT FOREARM  Final   Special Requests   Final    BOTTLES DRAWN AEROBIC AND ANAEROBIC Blood Culture adequate volume   Culture   Final    NO GROWTH 3 DAYS Performed at [facility], [address] [place], [place] [address]    Report Status PENDING  Incomplete  Blood Culture (routine x 2)     Status: None (Preliminary result)   Collection Time: 10/30/2020  8:43  PM   Specimen: BLOOD  Result Value Ref Range Status   Specimen Description BLOOD LEFT FOREARM  Final   Special Requests   Final    BOTTLES DRAWN AEROBIC AND ANAEROBIC Blood Culture results may not be optimal due to an excessive volume of blood received in culture bottles   Culture   Final    NO GROWTH 3 DAYS Performed at [facility], [address], [place], [place] [address]    Report Status PENDING  Incomplete  MRSA PCR  Screening     Status: None   Collection Time: 10/29/20  6:50 AM   Specimen: Nasal Mucosa; Nasopharyngeal  Result Value Ref Range Status   MRSA by PCR NEGATIVE NEGATIVE Final    Comment:        The GeneXpert MRSA Assay (FDA approved for NASAL specimens only), is one component of a comprehensive MRSA colonization surveillance program. It is not intended to diagnose MRSA infection nor to guide or monitor treatment for MRSA infections. Performed at [facility], [address] [place], [place] [address]       Imaging Studies   DG Chest [place] 1 View  Result Date: 10/30/2020 CLINICAL DATA:  Tachycardia.  COVID positive. EXAM: PORTABLE CHEST 1 VIEW COMPARISON:  October 28, 2020 FINDINGS: There are diffuse bilateral hazy airspace opacities which have significantly worsened since the prior study. There is no pneumothorax or large pleural effusion. The heart size is unchanged. Aortic calcifications are noted. IMPRESSION: Worsening multifocal airspace opacities consistent with the patient's history of viral pneumonia. Electronically Signed   By: Constance Holster M.D.   On: 10/30/2020 00:44   US Abdomen Limited RUQ (LIVER/GB)  Result Date: 10/30/2020 CLINICAL DATA:  Transaminitis EXAM: ULTRASOUND ABDOMEN LIMITED RIGHT UPPER QUADRANT COMPARISON:  None. FINDINGS: Gallbladder: There is sludge in the gallbladder with no wall thickening, pericholecystic fluid, stones, or Murphy's sign. Common bile duct: Diameter: 3.5 mm Liver: Increased echogenicity with no focal mass. Portal vein is patent on color Doppler imaging with normal direction of blood flow towards the liver. Other: None. IMPRESSION: 1. Mild sludge in the gallbladder. The gallbladder is otherwise normal in appearance. 2. Probable hepatic steatosis with increased echogenicity in the liver. Electronically Signed   By: Dorise Bullion III M.D   On: 10/30/2020 10:16     Medications   Scheduled Meds: . amLODipine  5 mg Oral Daily    And  . atorvastatin  10 mg Oral Daily  . vitamin C  500 mg Oral Daily  . enoxaparin (LOVENOX) injection  0.5 mg/kg Subcutaneous Q24H  . insulin aspart  0-20 Units Subcutaneous TID WC  . insulin aspart  0-5 Units Subcutaneous QHS  . insulin aspart  26 Units Subcutaneous TID WC  . insulin glargine  10 Units Subcutaneous Daily  . Ipratropium-Albuterol  1 puff Inhalation Q6H  . metoprolol succinate  25 mg Oral Daily  . phosphorus  500 mg Oral Once  . [START ON 11/01/2020] predniSONE  50 mg Oral Daily  . zinc sulfate  220 mg Oral Daily   Continuous Infusions: . sodium chloride 500 mL (10/30/20 2237)  . azithromycin 500 mg (10/31/20 0007)  . cefTRIAXone (ROCEPHIN)  IV    . remdesivir 100 mg in NS 100 mL 100 mg (10/31/20 1019)       LOS: 3 days    Time spent: 25 minutes with greater than 50% spent in coordination of care and direct patient contact    Ezekiel Slocumb, DO  [facility]  10/31/2020, 2:40 PM    If 7PM-7AM, please contact night-coverage. How to contact the [facility] Attending or Consulting provider [place] or covering provider during after hours [place], for this patient?    1. Check the care team in [facility] and look for a) attending/consulting [facility] provider listed and b) the [facility] team listed 2. Log into [email or web address] and use [place]'s universal password to access. If you do not have the password, please contact the hospital operator. 3. Locate the [facility] provider you are looking for under [facility] and page to a number that you can be directly reached. 4. If you still have difficulty reaching the provider, please page the [facility] (Director on Call) for the Hospitalists listed on amion for assistance.

## 2020-10-31 NOTE — Progress Notes (Signed)
Inpatient Diabetes Program Recommendations  AACE/ADA: New Consensus Statement on Inpatient Glycemic Control (2015)  Target Ranges:  Prepandial:   less than 140 mg/dL      Peak postprandial:   less than 180 mg/dL (1-2 hours)      Critically ill patients:  140 - 180 mg/dL   Lab Results  Component Value Date   GLUCAP 304 (H) 10/31/2020   HGBA1C 8.7 (H) 11/19/2020    Review of Glycemic Control Results for Jon, Thomas (MRN [id number]) as of 10/31/2020 15:52  Ref. Range 10/30/2020 22:07 10/31/2020 08:43 10/31/2020 12:41 10/31/2020 14:13 10/31/2020 14:33  Glucose-Capillary Latest Ref Range: 70 - 99 mg/dL 786 (H) 767 (H) 209 (H) 287 (H) 304 (H)    Inpatient Diabetes Program Recommendations:    If steroids continue please consider: Levemir 17 units BID  Will continue to follow while inpatient.  Thank you, Dulce Sellar, RN, BSN Diabetes Coordinator Inpatient Diabetes Program [phone] (team pager from 8a-5p)

## 2020-10-31 NOTE — Procedures (Signed)
Central Venous Catheter Insertion Procedure Note  Tacuma Rouillard  [id number]  1946-05-05  Date:10/31/20  Time:10:18 PM   Provider Performing:Shawndell Schillaci D Elvina Sidle   Procedure: Insertion of Non-tunneled Central Venous Catheter(36556) with US guidance (20254)   Indication(s) Medication administration and Difficult access  Consent Unable to obtain consent due to emergent nature of procedure.  Anesthesia Topical only with 1% lidocaine   Timeout Verified patient identification, verified procedure, site/side was marked, verified correct patient position, special equipment/implants available, medications/allergies/relevant history reviewed, required imaging and test results available.  Sterile Technique Maximal sterile technique including full sterile barrier drape, hand hygiene, sterile gown, sterile gloves, mask, hair covering, sterile ultrasound probe cover (if used).  Procedure Description Area of catheter insertion was cleaned with chlorhexidine and draped in sterile fashion.  With real-time ultrasound guidance a central venous catheter was placed into the right femoral vein. Nonpulsatile blood flow and easy flushing noted in all ports.  The catheter was sutured in place and sterile dressing applied.  Complications/Tolerance None; patient tolerated the procedure well. Chest X-ray is ordered to verify placement for internal jugular or subclavian cannulation.   Chest x-ray is not ordered for femoral cannulation.  EBL Minimal  Specimen(s) None    Line was secured at the 20 cm mark.  BIOPATCH was applied to the insertion site.    Harlon Ditty, AGACNP-BC [facility] Pager: [phone]

## 2020-10-31 NOTE — ED Provider Notes (Signed)
[facility]  Department of Emergency Medicine   Code Blue CONSULT NOTE  Chief Complaint: Cardiac arrest/unresponsive   Level V Caveat: Unresponsive  History of present illness: I was contacted by the hospital for a CODE BLUE cardiac arrest upstairs and presented to the patient's bedside.   Patient unresponsive on my arrival, supplemental oxygen being provided by bag-valve-mask and patient with occasional gasping respirations.  He was noted to have regained pulses and in sinus tachycardia on monitor.  He remains hypoxic with O2 sat in the 50s at the time of my arrival.  ROS: Unable to obtain, Level V caveat  Scheduled Meds: . amLODipine  5 mg Oral Daily   And  . atorvastatin  10 mg Oral Daily  . vitamin C  500 mg Oral Daily  . enoxaparin (LOVENOX) injection  0.5 mg/kg Subcutaneous Q24H  . insulin aspart  0-20 Units Subcutaneous TID WC  . insulin aspart  0-5 Units Subcutaneous QHS  . insulin aspart  26 Units Subcutaneous TID WC  . insulin glargine  10 Units Subcutaneous Daily  . Ipratropium-Albuterol  1 puff Inhalation Q6H  . metoprolol succinate  25 mg Oral Daily  . phosphorus  500 mg Oral Once  . [START ON 11/01/2020] predniSONE  50 mg Oral Daily  . zinc sulfate  220 mg Oral Daily   Continuous Infusions: . sodium chloride 500 mL (10/30/20 2237)  . azithromycin 500 mg (10/31/20 0007)  . cefTRIAXone (ROCEPHIN)  IV    . remdesivir 100 mg in NS 100 mL 100 mg (10/31/20 1019)   PRN Meds:.sodium chloride, acetaminophen, chlorpheniramine-HYDROcodone, guaiFENesin-dextromethorphan, ondansetron **OR** ondansetron (ZOFRAN) IV Past Medical History:  Diagnosis Date  . HLD (hyperlipidemia)   . Hypertension   . Venous stasis    Past Surgical History:  Procedure Laterality Date  . NO PAST SURGERIES     Social History   Socioeconomic History  . Marital status: Married    Spouse name: Not on file  . Number of children: Not on file  . Years of education: Not on file   . Highest education level: Not on file  Occupational History  . Not on file  Tobacco Use  . Smoking status: Never Smoker  . Smokeless tobacco: Never Used  Substance and Sexual Activity  . Alcohol use: No  . Drug use: No  . Sexual activity: Not on file  Other Topics Concern  . Not on file  Social History Narrative  . Not on file   Social Determinants of Health   Financial Resource Strain:   . Difficulty of Paying Living Expenses: Not on file  Food Insecurity:   . Worried About Programme researcher, broadcasting/film/video in the Last Year: Not on file  . Ran Out of Food in the Last Year: Not on file  Transportation Needs:   . Lack of Transportation (Medical): Not on file  . Lack of Transportation (Non-Medical): Not on file  Physical Activity:   . Days of Exercise per Week: Not on file  . Minutes of Exercise per Session: Not on file  Stress:   . Feeling of Stress : Not on file  Social Connections:   . Frequency of Communication with Friends and Family: Not on file  . Frequency of Social Gatherings with Friends and Family: Not on file  . Attends Religious Services: Not on file  . Active Member of Clubs or Organizations: Not on file  . Attends Banker Meetings: Not on file  . Marital  Status: Not on file  Intimate Partner Violence:   . Fear of Current or Ex-Partner: Not on file  . Emotionally Abused: Not on file  . Physically Abused: Not on file  . Sexually Abused: Not on file   No Known Allergies  Last set of Vital Signs (not current) Vitals:   10/31/20 1254 10/31/20 1315  BP:    Pulse:    Resp:    Temp:    SpO2: 90% 91%      Physical Exam  Gen: unresponsive Cardiovascular: Tachycardic with regular rhythm, palpable carotid pulse. Resp: Occasional gasping respirations. Breath sounds equal bilaterally with bagging  Abd: nondistended  Neuro: GCS 3, unresponsive to pain  HEENT: No blood in posterior pharynx, gag reflex absent  Neck: No crepitus  Musculoskeletal: No  deformity  Skin: warm  Procedures  INTUBATION Performed by: Chesley Noon Required items: required blood products, implants, devices, and special equipment available Patient identity confirmed: provided demographic data and hospital-assigned identification number Time out: Immediately prior to procedure a "time out" was called to verify the correct patient, procedure, equipment, support staff and site/side marked as required. Indications: Hypoxic respiratory failure Intubation method: Video laryngoscopy Preoxygenation: BVM Sedatives: Etomidate Paralytic: Rocuronium Tube Size: 8.0 cuffed Post-procedure assessment: chest rise and ETCO2 monitor Breath sounds: equal and absent over the epigastrium Tube secured by Respiratory Therapy Patient tolerated the procedure well with no immediate complications.  CRITICAL CARE Performed by: Chesley Noon Total critical care time: 15 Critical care time was exclusive of separately billable procedures and treating other patients. Critical care was necessary to treat or prevent imminent or life-threatening deterioration. Critical care was time spent personally by me on the following activities: development of treatment plan with patient and/or surrogate as well as nursing, discussions with consultants, evaluation of patient's response to treatment, examination of patient, obtaining history from patient or surrogate, ordering and performing treatments and interventions, ordering and review of laboratory studies, ordering and review of radiographic studies, pulse oximetry and re-evaluation of patient's condition.  Cardiopulmonary Resuscitation (CPR) Procedure Note  Directed/Performed by: Chesley Noon I personally directed ancillary staff and/or performed CPR in an effort to regain return of spontaneous circulation and to maintain cardiac, neuro and systemic perfusion.    Medical Decision making  On my arrival to the room, patient has had ROSC but  remains unresponsive with O2 sats in the 50s with bag-valve-mask ventilation.  Patient was repositioned and 2 hand technique utilized for application of bag-valve-mask with improvement of O2 sats into the low 90s.  He was subsequently intubated without difficulty, color change noted with equal breath sounds bilaterally.  O2 sats remained in the mid 90s and patient remained in sinus tachycardia with stable blood pressure.  Assessment and Plan  74 year old male, known cooperative positive with hypoxic respiratory failure previously on high flow nasal cannula found to be in cardiac arrest now with ROSC achieved prior to my arrival.  He continued to have hypoxic respiratory failure, subsequently was intubated without difficulty and had improvement in O2 sats.  Patient likely with hypoxic respiratory arrest now intubated, care was transferred to the ICU.    Chesley Noon, MD 10/31/20 [id number]

## 2020-10-31 NOTE — Progress Notes (Signed)
Pt is fully awake flailing arms , silent coughing on ventilator. Following commands, when asked not to pull at tube or foley but clearly anxious. Face became red and tears noted to his eyes nodded yes when asked if he was in pain. PRN Versed given and fentanyl titrated up for pt comfort.

## 2020-11-01 ENCOUNTER — Inpatient Hospital Stay: Admit: 2020-11-01 | Payer: Medicare Other

## 2020-11-01 DIAGNOSIS — I152 Hypertension secondary to endocrine disorders: Secondary | ICD-10-CM

## 2020-11-01 DIAGNOSIS — E1169 Type 2 diabetes mellitus with other specified complication: Secondary | ICD-10-CM | POA: Diagnosis not present

## 2020-11-01 DIAGNOSIS — E1159 Type 2 diabetes mellitus with other circulatory complications: Secondary | ICD-10-CM

## 2020-11-01 DIAGNOSIS — E669 Obesity, unspecified: Secondary | ICD-10-CM

## 2020-11-01 DIAGNOSIS — E8881 Metabolic syndrome: Secondary | ICD-10-CM | POA: Diagnosis not present

## 2020-11-01 DIAGNOSIS — J9601 Acute respiratory failure with hypoxia: Secondary | ICD-10-CM | POA: Diagnosis not present

## 2020-11-01 DIAGNOSIS — U071 COVID-19: Secondary | ICD-10-CM | POA: Diagnosis not present

## 2020-11-01 LAB — MAGNESIUM: Magnesium: 2.8 mg/dL — ABNORMAL HIGH (ref 1.7–2.4)

## 2020-11-01 LAB — CBC WITH DIFFERENTIAL/PLATELET
Abs Immature Granulocytes: 0.36 10*3/uL — ABNORMAL HIGH (ref 0.00–0.07)
Basophils Absolute: 0.1 10*3/uL (ref 0.0–0.1)
Basophils Relative: 0 %
Eosinophils Absolute: 0 10*3/uL (ref 0.0–0.5)
Eosinophils Relative: 0 %
HCT: 43.5 % (ref 39.0–52.0)
Hemoglobin: 13.4 g/dL (ref 13.0–17.0)
Immature Granulocytes: 2 %
Lymphocytes Relative: 5 %
Lymphs Abs: 1.2 10*3/uL (ref 0.7–4.0)
MCH: 26.5 pg (ref 26.0–34.0)
MCHC: 30.8 g/dL (ref 30.0–36.0)
MCV: 86.1 fL (ref 80.0–100.0)
Monocytes Absolute: 1.4 10*3/uL — ABNORMAL HIGH (ref 0.1–1.0)
Monocytes Relative: 7 %
Neutro Abs: 19 10*3/uL — ABNORMAL HIGH (ref 1.7–7.7)
Neutrophils Relative %: 86 %
Platelets: 245 10*3/uL (ref 150–400)
RBC: 5.05 MIL/uL (ref 4.22–5.81)
RDW: 13.9 % (ref 11.5–15.5)
Smear Review: NORMAL
WBC: 22 10*3/uL — ABNORMAL HIGH (ref 4.0–10.5)
nRBC: 0.1 % (ref 0.0–0.2)

## 2020-11-01 LAB — COMPREHENSIVE METABOLIC PANEL
ALT: 225 U/L — ABNORMAL HIGH (ref 0–44)
AST: 294 U/L — ABNORMAL HIGH (ref 15–41)
Albumin: 2.4 g/dL — ABNORMAL LOW (ref 3.5–5.0)
Alkaline Phosphatase: 47 U/L (ref 38–126)
Anion gap: 10 (ref 5–15)
BUN: 59 mg/dL — ABNORMAL HIGH (ref 8–23)
CO2: 23 mmol/L (ref 22–32)
Calcium: 8 mg/dL — ABNORMAL LOW (ref 8.9–10.3)
Chloride: 107 mmol/L (ref 98–111)
Creatinine, Ser: 1.95 mg/dL — ABNORMAL HIGH (ref 0.61–1.24)
GFR, Estimated: 35 mL/min — ABNORMAL LOW (ref >60)
Glucose, Bld: 219 mg/dL — ABNORMAL HIGH (ref 70–99)
Potassium: 4.3 mmol/L (ref 3.5–5.1)
Sodium: 140 mmol/L (ref 135–145)
Total Bilirubin: 0.7 mg/dL (ref 0.3–1.2)
Total Protein: 6.2 g/dL — ABNORMAL LOW (ref 6.5–8.1)

## 2020-11-01 LAB — BLOOD GAS, ARTERIAL
Acid-Base Excess: 1.6 mmol/L (ref 0.0–2.0)
Bicarbonate: 28.2 mmol/L — ABNORMAL HIGH (ref 20.0–28.0)
FIO2: 100
MECHVT: 500 mL
O2 Saturation: 92.3 %
PEEP: 10 cmH2O
Patient temperature: 37
RATE: 24 resp/min
pCO2 arterial: 51 mmHg — ABNORMAL HIGH (ref 32.0–48.0)
pH, Arterial: 7.35 (ref 7.350–7.450)
pO2, Arterial: 68 mmHg — ABNORMAL LOW (ref 83.0–108.0)

## 2020-11-01 LAB — FIBRIN DERIVATIVES D-DIMER (ARMC ONLY): Fibrin derivatives D-dimer (ARMC): 1427.61 ng/mL (FEU) — ABNORMAL HIGH (ref 0.00–499.00)

## 2020-11-01 LAB — TROPONIN I (HIGH SENSITIVITY): Troponin I (High Sensitivity): 385 ng/L (ref <18)

## 2020-11-01 LAB — GLUCOSE, CAPILLARY
Glucose-Capillary: 205 mg/dL — ABNORMAL HIGH (ref 70–99)
Glucose-Capillary: 206 mg/dL — ABNORMAL HIGH (ref 70–99)
Glucose-Capillary: 196 mg/dL — ABNORMAL HIGH (ref 70–99)
Glucose-Capillary: 217 mg/dL — ABNORMAL HIGH (ref 70–99)
Glucose-Capillary: 207 mg/dL — ABNORMAL HIGH (ref 70–99)
Glucose-Capillary: 220 mg/dL — ABNORMAL HIGH (ref 70–99)

## 2020-11-01 LAB — C-REACTIVE PROTEIN: CRP: 1.9 mg/dL — ABNORMAL HIGH (ref <1.0)

## 2020-11-01 LAB — HEPATITIS PANEL, ACUTE
HCV Ab: NONREACTIVE
Hep A IgM: NONREACTIVE
Hep B C IgM: NONREACTIVE
Hepatitis B Surface Ag: NONREACTIVE

## 2020-11-01 LAB — FERRITIN: Ferritin: 1061 ng/mL — ABNORMAL HIGH (ref 24–336)

## 2020-11-01 LAB — PROCALCITONIN: Procalcitonin: 0.95 ng/mL

## 2020-11-01 LAB — PHOSPHORUS: Phosphorus: 1.8 mg/dL — ABNORMAL LOW (ref 2.5–4.6)

## 2020-11-01 MED ORDER — POTASSIUM & SODIUM PHOSPHATES 280-160-250 MG PO PACK: 1.0000 | PACK | Freq: Three times a day (TID) | ORAL | Status: DC

## 2020-11-01 MED ORDER — K PHOS MONO-SOD PHOS DI & MONO 155-852-130 MG PO TABS
250.0000 mg | ORAL_TABLET | Freq: Two times a day (BID) | ORAL | Status: AC
  Administered 2020-11-01 (×2): 250 mg
  Filled 2020-11-01 (×2): qty 1

## 2020-11-01 MED ORDER — HYDROCORTISONE NA SUCCINATE PF 100 MG IJ SOLR
50.0000 mg | Freq: Four times a day (QID) | INTRAMUSCULAR | Status: DC
  Administered 2020-11-01 – 2020-11-02 (×4): 50 mg via INTRAVENOUS
  Filled 2020-11-01 (×4): qty 2

## 2020-11-01 MED ORDER — GUAIFENESIN-DM 100-10 MG/5ML PO SYRP: 10.0000 mL | ORAL_SOLUTION | ORAL | Status: DC | PRN

## 2020-11-01 MED ORDER — ACETAMINOPHEN 160 MG/5ML PO SOLN
325.0000 mg | Freq: Four times a day (QID) | ORAL | Status: DC | PRN
  Administered 2020-11-04: 325 mg
  Filled 2020-11-01 (×2): qty 20.3

## 2020-11-01 MED ORDER — METOPROLOL TARTRATE 25 MG/10 ML ORAL SUSPENSION
12.5000 mg | Freq: Two times a day (BID) | ORAL | Status: DC
  Filled 2020-11-01 (×4): qty 5

## 2020-11-01 MED ORDER — ZINC SULFATE 220 (50 ZN) MG PO CAPS
220.0000 mg | ORAL_CAPSULE | Freq: Every day | ORAL | Status: DC
  Administered 2020-11-02 – 2020-11-08 (×7): 220 mg
  Filled 2020-11-01 (×7): qty 1

## 2020-11-01 MED ORDER — PANTOPRAZOLE SODIUM 40 MG PO PACK
40.0000 mg | PACK | Freq: Every day | ORAL | Status: DC
  Administered 2020-11-01 – 2020-11-07 (×7): 40 mg
  Filled 2020-11-01 (×9): qty 20

## 2020-11-01 MED ORDER — HYDROCOD POLST-CPM POLST ER 10-8 MG/5ML PO SUER: 5.0000 mL | Freq: Two times a day (BID) | ORAL | Status: DC | PRN

## 2020-11-01 MED ORDER — VITAL HIGH PROTEIN PO LIQD
1000.0000 mL | ORAL | Status: DC
  Administered 2020-11-01: 1000 mL

## 2020-11-01 MED ORDER — ASCORBIC ACID 500 MG PO TABS
500.0000 mg | ORAL_TABLET | Freq: Every day | ORAL | Status: DC
  Administered 2020-11-02 – 2020-11-08 (×7): 500 mg
  Filled 2020-11-01 (×7): qty 1

## 2020-11-01 MED ORDER — STERILE WATER FOR INJECTION IJ SOLN
INTRAMUSCULAR | Status: AC
  Filled 2020-11-01: qty 10

## 2020-11-01 NOTE — Progress Notes (Addendum)
Shift summary:  - Patient remains intubated, sedated, on vasopressors.  - Patient proned at 0945 hrs.  - Weaning pressors and FiO2 as tolerated.

## 2020-11-01 NOTE — Progress Notes (Signed)
GOALS OF CARE DISCUSSION  The Clinical status was relayed to family in detail.  Updated and notified of patients medical condition.  Patient remains unresponsive and will not open eyes to command.   Upon assessment his breath sounds are course crackles with significant secretions to oral pharyngeal region.  Patient is having a weak cough and struggling to remove secretions.   patient with increased WOB and using accessory muscles to breathe Explained to family course of therapy and the modalities     Patient with Progressive multiorgan failure with very low chance of meaningful recovery despite all aggressive and optimal medical therapy.  Family understands the situation.   Family are satisfied with Plan of action and management. All questions answered  Additional CC time 32 mins   Leanthony Rhett Santiago Glad, M.D.  [facility]  Medical Director [facility] Medical Director [facility] Cardio-Pulmonary Department

## 2020-11-01 NOTE — Plan of Care (Signed)

## 2020-11-01 NOTE — Consult Note (Signed)
PHARMACY CONSULT NOTE  Pharmacy Consult for Electrolyte Monitoring and Replacement   Recent Labs: Potassium (mmol/L)  Date Value  11/01/2020 4.3   Magnesium (mg/dL)  Date Value  60/02/5996 2.8 (H)   Calcium (mg/dL)  Date Value  74/14/2395 8.0 (L)   Albumin (g/dL)  Date Value  32/12/3341 2.4 (L)   Phosphorus (mg/dL)  Date Value  56/86/1683 1.8 (L)   Sodium (mmol/L)  Date Value  11/01/2020 140   Corrected calcium: 9.3 mg/dL  Assessment: Patient is a 74 y/o M with medical history including morbid obesity, hypertension, chronic bilateral lower extremity venous stasis who was admitted 12/3 with COVID-19 pneumonia. Hospital course complicated by respiratory arrest on 12/6 requiring intubation. Patient transferred to the ICU and remains intubated, sedated, and on mechanical ventilation. Pharmacy consulted to assist with electrolyte monitoring and replacement as indicated.   12/7: Labs notable for worsening Scr, patient is oliguric. Plan is to initiate tube feeds today.   Goal of Therapy:  Electrolytes within normal limits  Plan:  --Phosphorous today 1.8, will order K Phos Neutral 250 mg x 2 per tube --Follow-up electrolytes tomorrow AM  Tressie Ellis 11/01/2020 11:57 AM

## 2020-11-01 NOTE — Progress Notes (Signed)
Initial Nutrition Assessment  DOCUMENTATION CODES:   Morbid obesity  INTERVENTION:  Initiate Vital High Protein at 25 mL/hr for today.  Tomorrow if electrolytes within acceptable range advance by 20 mL/hr every 12 hours to goal rate of Vital High Protein at 65 mL/hr (1560 mL goal daily volume). Also provide PROSource TF 45 mL once daily per tube. Goal regimen provides 1600 kcal, 148 grams of protein, 1310 mL H2O daily.  Monitor magnesium, potassium, and phosphorus daily for at least 3 days, MD to replete as needed, as pt is at risk for refeeding syndrome.  NUTRITION DIAGNOSIS:   Inadequate oral intake related to inability to eat as evidenced by NPO status.  GOAL:   Patient will meet greater than or equal to 90% of their needs  MONITOR:   Vent status, Labs, Weight trends, TF tolerance, I & O's  REASON FOR ASSESSMENT:   Ventilator, Consult Enteral/tube feeding initiation and management  ASSESSMENT:   74 year old male with PMHx of HTN, chronic bilateral venous stasis on Augmentin, HLD admitted after a fall with generalized weakness, found to have COVID-19 PNA.   12/6 transferred to ICU and intubated 12/7 pt proned  Patient is currently intubated on ventilator support MV: 11.2 L/min Temp (24hrs), Avg:96.3 F (35.7 C), Min:95.4 F (35.2 C), Max:97.4 F (36.3 C)  Medications reviewed and include: vitamin C 500 mg daily, Colace 100 mg BID, Solu-Cortef 50 mg Q6hrs IV, Novolog 0-20 units Q4hrs, Lantus 10 units daily, Protonix, K Phos neutral 250 mg BID per tube, Miralax, zinc sulfate 220 mg daily, azithromycin, ceftriaxone, fentanyl gtt, Versed gtt, norepinephrine gtt at 5 mcg/min.  Labs reviewed: CBG 205-206, BUN 59, Creatinine 1.95, Phosphorus 1.8. Magnesium 2.8.  I/O: 335 mL UOP yesterday  Weight trend: 113.4 kg on 12/6; suspect this wt is more accurate than 102 kg, which appears to be an estimated weight  Enteral Access: 16 Fr. OGT placed 12/6; terminates in distal  stomach per abdominal x-ray 12/7  Discussed with RN and on rounds. Plan is to start tube feeds today.  NUTRITION - FOCUSED PHYSICAL EXAM:  Deferred.  Diet Order:   Diet Order            Diet NPO time specified  Diet effective now                EDUCATION NEEDS:   No education needs have been identified at this time  Skin:  Skin Assessment: Skin Integrity Issues: (MASD bilateral groin)  Last BM:  10/31/2020 per chart  Height:   Ht Readings from Last 1 Encounters:  11/01/20 5' 2.99" (1.6 m)   Weight:   Wt Readings from Last 1 Encounters:  10/31/20 113.4 kg   Ideal Body Weight:  56.4 kg  BMI:  Body mass index is 44.31 kg/m.  Estimated Nutritional Needs:   Kcal:  1584  Protein:  140-150 grams  Fluid:  2 L/day  Felix Pacini, MS, RD, LDN Pager number available on Amion

## 2020-11-01 NOTE — Progress Notes (Signed)
Inpatient Diabetes Program Recommendations  AACE/ADA: New Consensus Statement on Inpatient Glycemic Control (2015)  Target Ranges:  Prepandial:   less than 140 mg/dL      Peak postprandial:   less than 180 mg/dL (1-2 hours)      Critically ill patients:  140 - 180 mg/dL   Lab Results  Component Value Date   GLUCAP 206 (H) 11/01/2020   HGBA1C 8.7 (H) November 20, 2020    Review of Glycemic Control Results for Jon Thomas, Jon Thomas (MRN [id number]) as of 11/01/2020 14:45  Ref. Range 10/31/2020 20:57 10/31/2020 23:17 11/01/2020 03:35 11/01/2020 07:26  Glucose-Capillary Latest Ref Range: 70 - 99 mg/dL 409 (H) 811 (H) 914 (H) 206 (H)   Diabetes history: DM 2 Outpatient Diabetes medications:  Metformin 500 mg with breakfast Current orders for Inpatient glycemic control:  Novolog resistant q 4 hours Solucortef 50 mg IV q 6 hours Lantus 10 units daily Inpatient Diabetes Program Recommendations:   Consider d/c of Lantus and add Levemir 10 units bid.   Thanks,  Beryl Meager, RN, BC-ADM Inpatient Diabetes Coordinator Pager [phone] (8a-5p)

## 2020-11-01 NOTE — Progress Notes (Signed)
CRITICAL CARE NOTE 74 y.o.malewith medical history significant forMorbidobesity, hypertension,chronic bilateral lower extremity venous stasis on chronic Augmentin per PCP, presented to his PCP on 11/06/2020 for chief concerns of falling at home after tripping on the rugand feeling generalizedweakness.  He denies hitting his head and loss of consciousness.  At his PCP office, patient was found to be hypoxic with SPO2 of 88% on room air. He was placed on 2 L nasal cannula and per documentation his SPO2 increased to 94%. He was sent to the emergency department for further evaluation.  In the emergency department, patient tested positive for COVID-19 infection. Patient is not vaccinated for COVID-19 infection. Patient states that he has been tested for COVID-19 2 times in the past and always tested negative therefore he felt like he did not require Covid vaccination.   ED Course:Discussed patient with ED provider, patient is hypoxic secondary to COVID-19 infection.    12/6 admitted to ICU s/p cardiac arrest 12/6 severe resp failure ARDS from COVID now on vent, fio2 at 100% 12/7 CVL placed, ART line placed  CC  follow up respiratory failure  SUBJECTIVE Patient remains critically ill Prognosis is guarded   BP (!) 111/58 (BP Location: Left Arm)   Pulse 61   Temp (!) 96.4 F (35.8 C) (Bladder)   Resp (!) 24   Ht 5' 2.99" (1.6 m)   Wt 113.4 kg   SpO2 96%   BMI 44.31 kg/m    I/O last 3 completed shifts: In: 1951.8 [P.O.:360; I.V.:679; NG/GT:45; IV Piggyback:867.7] Out: 735 [Urine:335; Emesis/NG output:400] Total I/O In: 34.6 [I.V.:34.6] Out: -   SpO2: 96 % O2 Flow Rate (L/min): 55 L/min FiO2 (%): 100 %  Estimated body mass index is 44.31 kg/m as calculated from the following:   Height as of this encounter: 5' 2.99" (1.6 m).   Weight as of this encounter: 113.4 kg.  Vent Mode: PRVC FiO2 (%):  [100 %] 100 % Set Rate:  [16 bmp-24 bmp] 24 bmp Vt Set:   [500 mL] 500 mL PEEP:  [10 cmH20] 10 cmH20 Plateau Pressure:  [19 cmH20-28 cmH20] 28 cmH20  CBC    Component Value Date/Time   WBC 22.0 (H) 11/01/2020 0333   RBC 5.05 11/01/2020 0333   HGB 13.4 11/01/2020 0333   HCT 43.5 11/01/2020 0333   PLT 245 11/01/2020 0333   MCV 86.1 11/01/2020 0333   MCH 26.5 11/01/2020 0333   MCHC 30.8 11/01/2020 0333   RDW 13.9 11/01/2020 0333   LYMPHSABS 1.2 11/01/2020 0333   MONOABS 1.4 (H) 11/01/2020 0333   EOSABS 0.0 11/01/2020 0333   BASOSABS 0.1 11/01/2020 0333   BMP Latest Ref Rng & Units 11/01/2020 10/31/2020 10/30/2020  Glucose 70 - 99 mg/dL 161(W) 960(A) 540(J)  BUN 8 - 23 mg/dL 81(X) 91(Y) 78(G)  Creatinine 0.61 - 1.24 mg/dL 9.56(O) 1.30 8.65(H)  Sodium 135 - 145 mmol/L 140 138 134(L)  Potassium 3.5 - 5.1 mmol/L 4.3 4.8 4.7  Chloride 98 - 111 mmol/L 107 105 99  CO2 22 - 32 mmol/L 23 23 23   Calcium 8.9 - 10.3 mg/dL 8.0(L) 8.1(L) 8.1(L)      REVIEW OF SYSTEMS  PATIENT IS UNABLE TO PROVIDE COMPLETE REVIEW OF SYSTEMS DUE TO SEVERE CRITICAL ILLNESS      COVID-19 DISASTER DECLARATION:   FULL CONTACT PHYSICAL EXAMINATION WAS NOT POSSIBLE DUE TO TREATMENT OF COVID-19  AND CONSERVATION OF PERSONAL PROTECTIVE EQUIPMENT, LIMITED EXAM FINDINGS INCLUDE-   PHYSICAL EXAMINATION:  GENERAL:critically ill appearing, +resp  distress NEUROLOGIC: obtunded, GCS<8   Patient assessed or the symptoms described in the history of present illness.  In the context of the Global COVID-19 pandemic, which necessitated consideration that the patient might be at risk for infection with the SARS-CoV-2 virus that causes COVID-19, Institutional protocols and algorithms that pertain to the evaluation of patients at risk for COVID-19 are in a state of rapid change based on information released by regulatory bodies including the CDC and federal and state organizations. These policies and algorithms were followed during the patient's care while in  hospital.    MEDICATIONS: I have reviewed all medications and confirmed regimen as documented   CULTURE RESULTS   Recent Results (from the past 240 hour(s))  Resp Panel by RT-PCR (Flu A&B, Covid) Nasopharyngeal Swab     Status: Abnormal   Collection Time: 11/17/2020  4:14 PM   Specimen: Nasopharyngeal Swab; Nasopharyngeal(NP) swabs in vial transport medium  Result Value Ref Range Status   SARS Coronavirus 2 by RT PCR POSITIVE (A) NEGATIVE Final    Comment: RESULT CALLED TO, READ BACK BY AND VERIFIED WITH: LINDSEY BLACK AT 1720 11/23/2020.PMF (NOTE) SARS-CoV-2 target nucleic acids are DETECTED.  The SARS-CoV-2 RNA is generally detectable in upper respiratory specimens during the acute phase of infection. Positive results are indicative of the presence of the identified virus, but do not rule out bacterial infection or co-infection with other pathogens not detected by the test. Clinical correlation with patient history and other diagnostic information is necessary to determine patient infection status. The expected result is Negative.  Fact Sheet for Patients: BloggerCourse.com  Fact Sheet for Healthcare Providers: SeriousBroker.it  This test is not yet approved or cleared by the [place] FDA and  has been authorized for detection and/or diagnosis of SARS-CoV-2 by FDA under an Emergency Use Authorization (EUA).  This EUA will remain in effect (meaning this test can be  used) for the duration of  the COVID-19 declaration under Section 564(b)(1) of the Act, 21 U.S.C. section 360bbb-3(b)(1), unless the authorization is terminated or revoked sooner.     Influenza A by PCR NEGATIVE NEGATIVE Final   Influenza B by PCR NEGATIVE NEGATIVE Final    Comment: (NOTE) The Xpert Xpress SARS-CoV-2/FLU/RSV plus assay is intended as an aid in the diagnosis of influenza from Nasopharyngeal swab specimens and should not be used as a sole  basis for treatment. Nasal washings and aspirates are unacceptable for Xpert Xpress SARS-CoV-2/FLU/RSV testing.  Fact Sheet for Patients: BloggerCourse.com  Fact Sheet for Healthcare Providers: SeriousBroker.it  This test is not yet approved or cleared by the [place] FDA and has been authorized for detection and/or diagnosis of SARS-CoV-2 by FDA under an Emergency Use Authorization (EUA). This EUA will remain in effect (meaning this test can be used) for the duration of the COVID-19 declaration under Section 564(b)(1) of the Act, 21 U.S.C. section 360bbb-3(b)(1), unless the authorization is terminated or revoked.  Performed at [facility], [address] [place], [place] [address]   Blood culture (routine x 2)     Status: None (Preliminary result)   Collection Time: 11/23/2020  8:42 PM   Specimen: BLOOD  Result Value Ref Range Status   Specimen Description BLOOD RIGHT FOREARM  Final   Special Requests   Final    BOTTLES DRAWN AEROBIC AND ANAEROBIC Blood Culture adequate volume   Culture   Final    NO GROWTH 4 DAYS Performed at [facility], [address] [place],  [place] [address]    Report Status PENDING  Incomplete  Blood Culture (routine x 2)     Status: None (Preliminary result)   Collection Time: 11/19/2020  8:43 PM   Specimen: BLOOD  Result Value Ref Range Status   Specimen Description BLOOD LEFT FOREARM  Final   Special Requests   Final    BOTTLES DRAWN AEROBIC AND ANAEROBIC Blood Culture results may not be optimal due to an excessive volume of blood received in culture bottles   Culture   Final    NO GROWTH 4 DAYS Performed at [facility], [address], [place], [place] [address]    Report Status PENDING  Incomplete  MRSA PCR Screening     Status: None   Collection Time: 10/29/20  6:50 AM   Specimen: Nasal Mucosa; Nasopharyngeal  Result Value Ref Range Status   MRSA by PCR  NEGATIVE NEGATIVE Final    Comment:        The GeneXpert MRSA Assay (FDA approved for NASAL specimens only), is one component of a comprehensive MRSA colonization surveillance program. It is not intended to diagnose MRSA infection nor to guide or monitor treatment for MRSA infections. Performed at [facility], [address] [place], [place] [address]   MRSA PCR Screening     Status: None   Collection Time: 10/31/20  5:16 PM   Specimen: Nasal Mucosa; Nasopharyngeal  Result Value Ref Range Status   MRSA by PCR NEGATIVE NEGATIVE Final    Comment:        The GeneXpert MRSA Assay (FDA approved for NASAL specimens only), is one component of a comprehensive MRSA colonization surveillance program. It is not intended to diagnose MRSA infection nor to guide or monitor treatment for MRSA infections. Performed at [facility], [address] [place], [place] [address]           IMAGING    DG Chest Port 1 View  Result Date: 10/31/2020 CLINICAL DATA:  Unsuccessful attempt at left IJ EXAM: PORTABLE CHEST 1 VIEW COMPARISON:  10/31/2020, 11/11/2020 FINDINGS: Endotracheal tube tip about a cm superior to the carina. Esophageal tube tip below the diaphragm but incompletely visualized. Additional tubing with punctate radiopaque marker overlies the distal mediastinum. No central venous catheter is evident. There is no pneumothorax. Extensive bilateral airspace infiltrates without significant change. IMPRESSION: 1. No significant interval change in extensive bilateral airspace disease. 2. Negative for left pneumothorax. Endotracheal tube tip about a cm superior to the carina. Electronically Signed   By: Jasmine Pang M.D.   On: 10/31/2020 22:40   DG Chest Port 1 View  Addendum Date: 10/31/2020   ADDENDUM REPORT: 10/31/2020 16:24 ADDENDUM: ETT position discussed by telephone with Dr. Erin Fulling on 10/31/2020 at 1609 hours. Electronically Signed   By: Odessa Fleming M.D.   On:  10/31/2020 16:24   Result Date: 10/31/2020 CLINICAL DATA:  74 year old male positive COVID-19.  Intubated. EXAM: PORTABLE CHEST 1 VIEW COMPARISON:  Portable chest 10/30/2020, CTA chest 11/15/2020 and earlier. FINDINGS: Portable AP semi upright view at 1523 hours. Endotracheal tube tip now in place and at or very near the carina (arrow). Enteric tube placed into the stomach, tip not included. Stable lung volumes and mediastinal contours. Patchy asymmetric bilateral pulmonary opacity greatest in the right upper lung and left lung base has not significantly changed. No pneumothorax. No pleural effusion is evident. No areas of worsening ventilation. Negative visible bowel gas pattern. IMPRESSION: 1. Endotracheal tube tip at or very  near the carina. Recommend retraction of 1 cm for more optimal placement. 2. Enteric tube placed into the stomach, tip not included. 3. Stable bilateral COVID-19 pneumonia. Electronically Signed: By: Odessa FlemingH  Hall M.D. On: 10/31/2020 15:45   DG Abd Portable 1V  Result Date: 10/31/2020 CLINICAL DATA:  74 year old male positive COVID-19.  Intubated. EXAM: PORTABLE ABDOMEN - 1 VIEW COMPARISON:  Portable chest from the same time today. FINDINGS: Portable AP semi upright view at 1523 hours. Enteric tube courses into the stomach with tip at the gastric antrum. Side hole is at the junction of the distal body and antrum. Visible bowel gas pattern is within normal limits. No acute osseous abnormality identified. IMPRESSION: Enteric tube placed with tip at the distal stomach. Electronically Signed   By: Odessa FlemingH  Hall M.D.   On: 10/31/2020 15:46     Nutrition Status:           Indwelling Urinary Catheter continued, requirement due to   Reason to continue Indwelling Urinary Catheter strict Intake/Output monitoring for hemodynamic instability   Central Line/ continued, requirement due to  Reason to continue [employer] Line Monitoring of central venous pressure or other hemodynamic parameters and  poor IV access   Ventilator continued, requirement due to severe respiratory failure   Ventilator Sedation RASS 0 to -2      ASSESSMENT AND PLAN SYNOPSIS  Acute hypoxemic respiratory failure due to COVID-19 pneumonia / ARDS Mechanical ventilation via ARDS protocol, target PRVC 6 cc/kg Wean PEEP and FiO2 as able Goal plateau pressure less than 30, driving pressure less than 15 Paralytics if necessary for vent synchrony, gas exchange Cycle prone positioning if necessary for oxygenation Deep sedation per PAD protocol, goal RASS -4, currently fentanyl, midazolam Diuresis as blood pressure and renal function can tolerate, goal CVP 5-8.   diuresis as tolerated based on Kidney function VAP prevention order set Remdesivir  IV STEROIDS    Severe ACUTE Hypoxic and Hypercapnic Respiratory Failure -continue Full MV support -continue Bronchodilator Therapy -Wean Fio2 and PEEP as tolerated -VAP/VENT bundle implementation  ACUTE DIASTOLIC CARDIAC FAILURE-  -oxygen as needed -Lasix as tolerated -follow up cardiac enzymes as indicated   Morbid obesity, possible OSA.   Will certainly impact respiratory mechanics, ventilator weaning Suspect will need to consider additional PEEP  ACUTE KIDNEY INJURY/Renal Failure -continue Foley Catheter-assess need -Avoid nephrotoxic agents -Follow urine output, BMP -Ensure adequate renal perfusion, optimize oxygenation -Renal dose medications     NEUROLOGY Acute toxic metabolic encephalopathy, need for sedation Goal RASS -2 to -3  SHOCK-SEPSIS due to COVID -use vasopressors to keep MAP>65 -follow ABG and LA -follow up cultures -emperic ABX -consider stress dose steroids  CARDIAC ICU monitoring  ID -continue IV abx as prescibed -follow up cultures  GI GI PROPHYLAXIS as indicated   DIET-->TF's as tolerated Constipation protocol as indicated  ENDO - will use ICU hypoglycemic\Hyperglycemia protocol if indicated      ELECTROLYTES -follow labs as needed -replace as needed -pharmacy consultation and following   DVT/GI PRX ordered and assessed TRANSFUSIONS AS NEEDED MONITOR FSBS I Assessed the need for Labs I Assessed the need for Foley I Assessed the need for Central Venous Line Family Discussion when available I Assessed the need for Mobilization I made an Assessment of medications to be adjusted accordingly Safety Risk assessment completed   CASE DISCUSSED IN MULTIDISCIPLINARY ROUNDS WITH ICU TEAM  Critical Care Time devoted to patient care services described in this note is 56 minutes.   Overall, patient is critically  ill, prognosis is guarded.  Patient with Multiorgan failure and at high risk for cardiac arrest and death.    Lucie Leather, M.D.  [facility]  Medical Director [facility] Medical Director [facility] Cardio-Pulmonary Department

## 2020-11-02 ENCOUNTER — Inpatient Hospital Stay
Admit: 2020-11-02 | Discharge: 2020-11-02 | Disposition: A | Discharge status: Home / Self Care | Payer: Medicare Other | Attending: Pulmonary Disease | Admitting: Pulmonary Disease

## 2020-11-02 DIAGNOSIS — J80 Acute respiratory distress syndrome: Secondary | ICD-10-CM

## 2020-11-02 LAB — COMPREHENSIVE METABOLIC PANEL
ALT: 151 U/L — ABNORMAL HIGH (ref 0–44)
AST: 109 U/L — ABNORMAL HIGH (ref 15–41)
Albumin: 2.4 g/dL — ABNORMAL LOW (ref 3.5–5.0)
Alkaline Phosphatase: 47 U/L (ref 38–126)
Anion gap: 10 (ref 5–15)
BUN: 79 mg/dL — ABNORMAL HIGH (ref 8–23)
CO2: 25 mmol/L (ref 22–32)
Calcium: 7.7 mg/dL — ABNORMAL LOW (ref 8.9–10.3)
Chloride: 109 mmol/L (ref 98–111)
Creatinine, Ser: 2.5 mg/dL — ABNORMAL HIGH (ref 0.61–1.24)
GFR, Estimated: 26 mL/min — ABNORMAL LOW (ref >60)
Glucose, Bld: 277 mg/dL — ABNORMAL HIGH (ref 70–99)
Potassium: 4.4 mmol/L (ref 3.5–5.1)
Sodium: 144 mmol/L (ref 135–145)
Total Bilirubin: 0.6 mg/dL (ref 0.3–1.2)
Total Protein: 6.2 g/dL — ABNORMAL LOW (ref 6.5–8.1)

## 2020-11-02 LAB — CULTURE, BLOOD (ROUTINE X 2)
Culture: NO GROWTH
Culture: NO GROWTH
Special Requests: ADEQUATE

## 2020-11-02 LAB — CBC WITH DIFFERENTIAL/PLATELET
Abs Immature Granulocytes: 0.36 10*3/uL — ABNORMAL HIGH (ref 0.00–0.07)
Basophils Absolute: 0.1 10*3/uL (ref 0.0–0.1)
Basophils Relative: 0 %
Eosinophils Absolute: 0 10*3/uL (ref 0.0–0.5)
Eosinophils Relative: 0 %
HCT: 40.6 % (ref 39.0–52.0)
Hemoglobin: 13.1 g/dL (ref 13.0–17.0)
Immature Granulocytes: 2 %
Lymphocytes Relative: 6 %
Lymphs Abs: 1.3 10*3/uL (ref 0.7–4.0)
MCH: 27.4 pg (ref 26.0–34.0)
MCHC: 32.3 g/dL (ref 30.0–36.0)
MCV: 84.9 fL (ref 80.0–100.0)
Monocytes Absolute: 1.2 10*3/uL — ABNORMAL HIGH (ref 0.1–1.0)
Monocytes Relative: 6 %
Neutro Abs: 17.5 10*3/uL — ABNORMAL HIGH (ref 1.7–7.7)
Neutrophils Relative %: 86 %
Platelets: 228 10*3/uL (ref 150–400)
RBC: 4.78 MIL/uL (ref 4.22–5.81)
RDW: 14 % (ref 11.5–15.5)
WBC: 20.4 10*3/uL — ABNORMAL HIGH (ref 4.0–10.5)
nRBC: 0 % (ref 0.0–0.2)

## 2020-11-02 LAB — GLUCOSE, CAPILLARY
Glucose-Capillary: 244 mg/dL — ABNORMAL HIGH (ref 70–99)
Glucose-Capillary: 250 mg/dL — ABNORMAL HIGH (ref 70–99)
Glucose-Capillary: 274 mg/dL — ABNORMAL HIGH (ref 70–99)
Glucose-Capillary: 284 mg/dL — ABNORMAL HIGH (ref 70–99)
Glucose-Capillary: 233 mg/dL — ABNORMAL HIGH (ref 70–99)
Glucose-Capillary: 282 mg/dL — ABNORMAL HIGH (ref 70–99)

## 2020-11-02 LAB — MAGNESIUM: Magnesium: 2.8 mg/dL — ABNORMAL HIGH (ref 1.7–2.4)

## 2020-11-02 LAB — ECHOCARDIOGRAM COMPLETE
Height: 62.992 in
S' Lateral: 2.76 cm
Weight: 4001.6 oz

## 2020-11-02 LAB — C-REACTIVE PROTEIN: CRP: 2.6 mg/dL — ABNORMAL HIGH (ref <1.0)

## 2020-11-02 LAB — FIBRIN DERIVATIVES D-DIMER (ARMC ONLY): Fibrin derivatives D-dimer (ARMC): 1196.73 ng/mL (FEU) — ABNORMAL HIGH (ref 0.00–499.00)

## 2020-11-02 LAB — PHOSPHORUS: Phosphorus: 2.9 mg/dL (ref 2.5–4.6)

## 2020-11-02 LAB — FERRITIN: Ferritin: 631 ng/mL — ABNORMAL HIGH (ref 24–336)

## 2020-11-02 MED ORDER — VITAL HIGH PROTEIN PO LIQD
1000.0000 mL | ORAL | Status: DC
  Administered 2020-11-02 – 2020-11-08 (×7): 1000 mL

## 2020-11-02 MED ORDER — INSULIN ASPART 100 UNIT/ML ~~LOC~~ SOLN
3.0000 [IU] | SUBCUTANEOUS | Status: DC
  Administered 2020-11-02 – 2020-11-03 (×6): 3 [IU] via SUBCUTANEOUS
  Filled 2020-11-02 (×6): qty 1

## 2020-11-02 MED ORDER — HYDROCORTISONE NA SUCCINATE PF 100 MG IJ SOLR
50.0000 mg | Freq: Three times a day (TID) | INTRAMUSCULAR | Status: DC
  Administered 2020-11-02 – 2020-11-04 (×6): 50 mg via INTRAVENOUS
  Filled 2020-11-02 (×6): qty 2

## 2020-11-02 MED ORDER — INSULIN GLARGINE 100 UNIT/ML ~~LOC~~ SOLN
10.0000 [IU] | Freq: Two times a day (BID) | SUBCUTANEOUS | Status: DC
  Administered 2020-11-02 – 2020-11-03 (×2): 10 [IU] via SUBCUTANEOUS
  Filled 2020-11-02 (×3): qty 0.1

## 2020-11-02 MED ORDER — PROSOURCE TF PO LIQD
45.0000 mL | Freq: Every day | ORAL | Status: DC
  Administered 2020-11-02 – 2020-11-08 (×7): 45 mL
  Filled 2020-11-02 (×7): qty 45

## 2020-11-02 MED ORDER — ENOXAPARIN SODIUM 40 MG/0.4ML ~~LOC~~ SOLN
40.0000 mg | SUBCUTANEOUS | Status: DC
  Administered 2020-11-02 – 2020-11-06 (×5): 40 mg via SUBCUTANEOUS
  Filled 2020-11-02 (×5): qty 0.4

## 2020-11-02 NOTE — Progress Notes (Signed)
CRITICAL CARE NOTE 74 y.o.malewith medical history significant forMorbidobesity, hypertension,chronic bilateral lower extremity venous stasis on chronic Augmentin per PCP, presented to his PCP on 10/31/2020 for chief concerns of falling at home after tripping on the rugand feeling generalizedweakness.  He denies hitting his head and loss of consciousness.  At his PCP office, patient was found to be hypoxic with SPO2 of 88% on room air. He was placed on 2 L nasal cannula and per documentation his SPO2 increased to 94%. He was sent to the emergency department for further evaluation.  In the emergency department, patient tested positive for COVID-19 infection.Patient is not vaccinated for COVID-19 infection. Patient states that he has been tested for COVID-19 2 times in the past and always tested negative therefore he felt like he did not require Covid vaccination.   ED Course:Discussed patient with ED provider, patient is hypoxic secondary to COVID-19 infection.    12/6 admitted to ICU s/p cardiac arrest 12/6 severe resp failure ARDS from COVID now on vent, fio2 at 100% 12/7 CVL placed, ART line placed 12/7 PRONED FOR 16 hrs    CC  follow up respiratory failure  SUBJECTIVE Patient remains critically ill Prognosis is guarded  Vent Mode: PRVC FiO2 (%):  [60 %-100 %] 60 % Set Rate:  [24 bmp] 24 bmp Vt Set:  [500 mL] 500 mL PEEP:  [10 cmH20] 10 cmH20 Plateau Pressure:  [28 cmH20] 28 cmH20 CBC    Component Value Date/Time   WBC 20.4 (H) 11/02/2020 0329   RBC 4.78 11/02/2020 0329   HGB 13.1 11/02/2020 0329   HCT 40.6 11/02/2020 0329   PLT 228 11/02/2020 0329   MCV 84.9 11/02/2020 0329   MCH 27.4 11/02/2020 0329   MCHC 32.3 11/02/2020 0329   RDW 14.0 11/02/2020 0329   LYMPHSABS 1.3 11/02/2020 0329   MONOABS 1.2 (H) 11/02/2020 0329   EOSABS 0.0 11/02/2020 0329   BASOSABS 0.1 11/02/2020 0329   BMP Latest Ref Rng & Units 11/02/2020 11/01/2020 10/31/2020   Glucose 70 - 99 mg/dL 409(W277(H) 119(J219(H) 478(G289(H)  BUN 8 - 23 mg/dL 95(A79(H) 21(H59(H) 08(M46(H)  Creatinine 0.61 - 1.24 mg/dL 5.78(I2.50(H) 6.96(E1.95(H) 9.521.24  Sodium 135 - 145 mmol/L 144 140 138  Potassium 3.5 - 5.1 mmol/L 4.4 4.3 4.8  Chloride 98 - 111 mmol/L 109 107 105  CO2 22 - 32 mmol/L 25 23 23   Calcium 8.9 - 10.3 mg/dL 7.7(L) 8.0(L) 8.1(L)    BP (!) 110/44   Pulse 70   Temp 98.1 F (36.7 C)   Resp (!) 24   Ht 5' 2.99" (1.6 m)   Wt 113.4 kg   SpO2 94%   BMI 44.31 kg/m    I/O last 3 completed shifts: In: 2964.2 [I.V.:1359.3; NG/GT:325; IV Piggyback:1279.9] Out: 875 [Urine:475; Emesis/NG output:400] Total I/O In: 47 [I.V.:47] Out: 15 [Urine:15]  SpO2: 94 % O2 Flow Rate (L/min): 55 L/min FiO2 (%): 60 %  Estimated body mass index is 44.31 kg/m as calculated from the following:   Height as of this encounter: 5' 2.99" (1.6 m).   Weight as of this encounter: 113.4 kg.  SIGNIFICANT EVENTS   REVIEW OF SYSTEMS  PATIENT IS UNABLE TO PROVIDE COMPLETE REVIEW OF SYSTEMS DUE TO SEVERE CRITICAL ILLNESS      COVID-19 DISASTER DECLARATION:   FULL CONTACT PHYSICAL EXAMINATION WAS NOT POSSIBLE DUE TO TREATMENT OF COVID-19  AND CONSERVATION OF PERSONAL PROTECTIVE EQUIPMENT, LIMITED EXAM FINDINGS INCLUDE-   PHYSICAL EXAMINATION:  GENERAL:critically ill appearing, +resp distress NEUROLOGIC:  obtunded, GCS<8   Patient assessed or the symptoms described in the history of present illness.  In the context of the Global COVID-19 pandemic, which necessitated consideration that the patient might be at risk for infection with the SARS-CoV-2 virus that causes COVID-19, Institutional protocols and algorithms that pertain to the evaluation of patients at risk for COVID-19 are in a state of rapid change based on information released by regulatory bodies including the CDC and federal and state organizations. These policies and algorithms were followed during the patient's care while in  hospital.    MEDICATIONS: I have reviewed all medications and confirmed regimen as documented   CULTURE RESULTS   Recent Results (from the past 240 hour(s))  Resp Panel by RT-PCR (Flu A&B, Covid) Nasopharyngeal Swab     Status: Abnormal   Collection Time: 11/25/2020  4:14 PM   Specimen: Nasopharyngeal Swab; Nasopharyngeal(NP) swabs in vial transport medium  Result Value Ref Range Status   SARS Coronavirus 2 by RT PCR POSITIVE (A) NEGATIVE Final    Comment: RESULT CALLED TO, READ BACK BY AND VERIFIED WITH: LINDSEY BLACK AT 1720 11/10/2020.PMF (NOTE) SARS-CoV-2 target nucleic acids are DETECTED.  The SARS-CoV-2 RNA is generally detectable in upper respiratory specimens during the acute phase of infection. Positive results are indicative of the presence of the identified virus, but do not rule out bacterial infection or co-infection with other pathogens not detected by the test. Clinical correlation with patient history and other diagnostic information is necessary to determine patient infection status. The expected result is Negative.  Fact Sheet for Patients: BloggerCourse.com  Fact Sheet for Healthcare Providers: SeriousBroker.it  This test is not yet approved or cleared by the [place] FDA and  has been authorized for detection and/or diagnosis of SARS-CoV-2 by FDA under an Emergency Use Authorization (EUA).  This EUA will remain in effect (meaning this test can be  used) for the duration of  the COVID-19 declaration under Section 564(b)(1) of the Act, 21 U.S.C. section 360bbb-3(b)(1), unless the authorization is terminated or revoked sooner.     Influenza A by PCR NEGATIVE NEGATIVE Final   Influenza B by PCR NEGATIVE NEGATIVE Final    Comment: (NOTE) The Xpert Xpress SARS-CoV-2/FLU/RSV plus assay is intended as an aid in the diagnosis of influenza from Nasopharyngeal swab specimens and should not be used as a sole  basis for treatment. Nasal washings and aspirates are unacceptable for Xpert Xpress SARS-CoV-2/FLU/RSV testing.  Fact Sheet for Patients: BloggerCourse.com  Fact Sheet for Healthcare Providers: SeriousBroker.it  This test is not yet approved or cleared by the [place] FDA and has been authorized for detection and/or diagnosis of SARS-CoV-2 by FDA under an Emergency Use Authorization (EUA). This EUA will remain in effect (meaning this test can be used) for the duration of the COVID-19 declaration under Section 564(b)(1) of the Act, 21 U.S.C. section 360bbb-3(b)(1), unless the authorization is terminated or revoked.  Performed at [facility], [address] [place], [place] [address]   Blood culture (routine x 2)     Status: None   Collection Time: 11/14/2020  8:42 PM   Specimen: BLOOD  Result Value Ref Range Status   Specimen Description BLOOD RIGHT FOREARM  Final   Special Requests   Final    BOTTLES DRAWN AEROBIC AND ANAEROBIC Blood Culture adequate volume   Culture   Final    NO GROWTH 5 DAYS Performed at [facility], [address] [place], [place] [address]  Report Status 11/02/2020 FINAL  Final  Blood Culture (routine x 2)     Status: None   Collection Time: 11/01/2020  8:43 PM   Specimen: BLOOD  Result Value Ref Range Status   Specimen Description BLOOD LEFT FOREARM  Final   Special Requests   Final    BOTTLES DRAWN AEROBIC AND ANAEROBIC Blood Culture results may not be optimal due to an excessive volume of blood received in culture bottles   Culture   Final    NO GROWTH 5 DAYS Performed at [facility], [address] [place], [place] [address]    Report Status 11/02/2020 FINAL  Final  MRSA PCR Screening     Status: None   Collection Time: 10/29/20  6:50 AM   Specimen: Nasal Mucosa; Nasopharyngeal  Result Value Ref Range Status   MRSA by PCR NEGATIVE NEGATIVE Final     Comment:        The GeneXpert MRSA Assay (FDA approved for NASAL specimens only), is one component of a comprehensive MRSA colonization surveillance program. It is not intended to diagnose MRSA infection nor to guide or monitor treatment for MRSA infections. Performed at [facility], [address] [place], [place] [address]   MRSA PCR Screening     Status: None   Collection Time: 10/31/20  5:16 PM   Specimen: Nasal Mucosa; Nasopharyngeal  Result Value Ref Range Status   MRSA by PCR NEGATIVE NEGATIVE Final    Comment:        The GeneXpert MRSA Assay (FDA approved for NASAL specimens only), is one component of a comprehensive MRSA colonization surveillance program. It is not intended to diagnose MRSA infection nor to guide or monitor treatment for MRSA infections. Performed at [facility], [address], [place], [place] [address]           IMAGING    No results found.   Nutrition Status: Nutrition Problem: Inadequate oral intake Etiology: inability to eat Signs/Symptoms: NPO status Interventions: Tube feeding, Prostat     Indwelling Urinary Catheter continued, requirement due to   Reason to continue Indwelling Urinary Catheter strict Intake/Output monitoring for hemodynamic instability   Central Line/ continued, requirement due to  Reason to continue [employer] Monitoring of central venous pressure or other hemodynamic parameters and poor IV access   Ventilator continued, requirement due to severe respiratory failure   Ventilator Sedation RASS 0 to -2      ASSESSMENT AND PLAN SYNOPSIS  Acute hypoxemic respiratory failure due to COVID-19 pneumonia / ARDS Mechanical ventilation via ARDS protocol, target PRVC 6 cc/kg Wean PEEP and FiO2 as able Goal plateau pressure less than 30, driving pressure less than 15 Paralytics if necessary for vent synchrony, gas exchange Cycle prone positioning if necessary for oxygenation Deep  sedation per PAD protocol, goal RASS -4, currently fentanyl, midazolam Diuresis as blood pressure and renal function can tolerate, goal CVP 5-8.   diuresis as tolerated based on Kidney function VAP prevention order set Remdesivir  IV STEROIDS   Severe ACUTE Hypoxic and Hypercapnic Respiratory Failure -continue Full MV support -continue Bronchodilator Therapy -Wean Fio2 and PEEP as tolerated -VAP/VENT bundle implementation  ACUTE DAISTOLIC CARDIAC FAILURE- EF -oxygen as needed -Lasix as tolerated   Morbid obesity, possible OSA.   Will certainly impact respiratory mechanics, ventilator weaning Suspect will need to consider additional PEEP   ACUTE KIDNEY INJURY/Renal Failure -continue Foley Catheter-assess need -Avoid nephrotoxic agents -Follow urine output, BMP -Ensure adequate renal perfusion, optimize  oxygenation -Renal dose medications     NEUROLOGY Acute toxic metabolic encephalopathy, need for sedation Goal RASS -2 to -3  SHOCK-SEPSIS/ -use vasopressors to keep MAP>65 -follow ABG and LA -follow up cultures -emperic ABX stress dose steroids   CARDIAC ICU monitoring  ID -continue IV abx as prescibed -follow up cultures  GI GI PROPHYLAXIS as indicated   DIET-->TF's as tolerated Constipation protocol as indicated  ENDO - will use ICU hypoglycemic\Hyperglycemia protocol if indicated     ELECTROLYTES -follow labs as needed -replace as needed -pharmacy consultation and following   DVT/GI PRX ordered and assessed TRANSFUSIONS AS NEEDED MONITOR FSBS I Assessed the need for Labs I Assessed the need for Foley I Assessed the need for Central Venous Line Family Discussion when available I Assessed the need for Mobilization I made an Assessment of medications to be adjusted accordingly Safety Risk assessment completed   CASE DISCUSSED IN MULTIDISCIPLINARY ROUNDS WITH ICU TEAM  Critical Care Time devoted to patient care services described in this  note is 56 minutes.   Overall, patient is critically ill, prognosis is guarded.  Patient with Multiorgan failure and at high risk for cardiac arrest and death.    Lucie Leather, M.D.  [facility]  Medical Director [facility] Medical Director [facility] Cardio-Pulmonary Department

## 2020-11-02 NOTE — Progress Notes (Signed)
GOALS OF CARE DISCUSSION  The Clinical status was relayed to family in detail-Brother with wife consent  Updated and notified of patients medical condition.   Patient is having a weak cough and struggling to remove secretions.   patient with increased WOB and using accessory muscles to breathe Explained to family course of therapy and the modalities     Patient with Progressive multiorgan failure with very low chance of meaningful recovery despite all aggressive and optimal medical therapy. Patient is in the Dying  Process associated with Suffering.  Family understands the situation.  They have consented and agreed to DNR/DNI.  They have decided on comfort care measures but I have suggested to assess this  decision in 24-48 hrs  Family are satisfied with Plan of action and management. All questions answered  Additional CC time 32 mins   Lachina Salsberry Santiago Glad, M.D.  [facility]  Medical Director [facility] Medical Director [facility] Cardio-Pulmonary Department

## 2020-11-02 NOTE — Progress Notes (Signed)
Pt responds to pain. Proned per MD at approximately 1000, tolerating well. ETT and OG intact. Foley putting out inadequate output and MD made aware. VSS on levophed. Will continue to monitor.

## 2020-11-02 NOTE — Consult Note (Signed)
PHARMACY CONSULT NOTE  Pharmacy Consult for Electrolyte Monitoring and Replacement   Recent Labs: Potassium (mmol/L)  Date Value  11/02/2020 4.4   Magnesium (mg/dL)  Date Value  16/60/6301 2.8 (H)   Calcium (mg/dL)  Date Value  60/08/9322 7.7 (L)   Albumin (g/dL)  Date Value  55/73/2202 2.4 (L)   Phosphorus (mg/dL)  Date Value  54/27/0623 2.9   Sodium (mmol/L)  Date Value  11/02/2020 144   Corrected calcium: 9.3 mg/dL  Assessment: Patient is a 74 y/o M with medical history including morbid obesity, hypertension, chronic bilateral lower extremity venous stasis who was admitted 12/3 with COVID-19 pneumonia. Hospital course complicated by respiratory arrest on 12/6 requiring intubation. Patient transferred to the ICU and remains intubated, sedated, and on mechanical ventilation. Pharmacy consulted to assist with electrolyte monitoring and replacement as indicated.   12/8: Labs notable for worsening Scr (1.24 >> 1.95 >> 2.5), patient is oliguric. Tube feeds started 12/7.   Goal of Therapy:  Electrolytes within normal limits  Plan:  --Phosphorous 2.9 from 1.8 yesterday with replacement --No electrolyte replacement warranted today --Follow-up electrolytes tomorrow AM  Tressie Ellis 11/02/2020 8:27 AM

## 2020-11-02 NOTE — Progress Notes (Signed)
*  PRELIMINARY RESULTS* Echocardiogram 2D Echocardiogram has been performed.  Jon Thomas 11/02/2020, 8:43 AM

## 2020-11-02 NOTE — Progress Notes (Signed)
Inpatient Diabetes Program Recommendations  AACE/ADA: New Consensus Statement on Inpatient Glycemic Control (2015)  Target Ranges:  Prepandial:   less than 140 mg/dL      Peak postprandial:   less than 180 mg/dL (1-2 hours)      Critically ill patients:  140 - 180 mg/dL   Lab Results  Component Value Date   GLUCAP 274 (H) 11/02/2020   HGBA1C 8.7 (H) 11/02/2020    Review of Glycemic Control Results for Jon Thomas, Jon Thomas (MRN [id number]) as of 11/02/2020 13:47  Ref. Range 11/01/2020 23:20 11/02/2020 03:32 11/02/2020 07:29 11/02/2020 11:12  Glucose-Capillary Latest Ref Range: 70 - 99 mg/dL 412 (H) 878 (H) 676 (H) 274 (H)   Diabetes history: DM 2 Outpatient Diabetes medications:  Metformin 500 mg q AM Current orders for Inpatient glycemic control:  Vital 65 cc/hr Novolog resistant q 4 hours Lantus 10 units daily Inpatient Diabetes Program Recommendations:     Patient would likely benefit from ICU Hyperglycemia order set with start of IV insulin. If IV insulin is not appropriate, consider Lantus 10 units bid and Novolog 3 units q 4 hours (for tube feed coverage)-   Thanks,  Beryl Meager, RN, BC-ADM Inpatient Diabetes Coordinator Pager [phone] (8a-5p)

## 2020-11-02 NOTE — Progress Notes (Addendum)
PHARMACIST - PHYSICIAN COMMUNICATION  CONCERNING:  Enoxaparin (Lovenox) for DVT Prophylaxis    RECOMMENDATION: Patient was prescribed enoxaparin 57.5 mg q24 hours for VTE prophylaxis.   Filed Weights   10/29/20 0200 10/30/20 0519 10/31/20 0305  Weight: 102 kg (224 lb 13.9 oz) 114.8 kg (253 lb) 113.4 kg (250 lb 1.6 oz)    Body mass index is 44.31 kg/m.  Estimated Creatinine Clearance: 28.7 mL/min (A) (by C-G formula based on SCr of 2.5 mg/dL (H)).   Patient is candidate for enoxaparin 40 mg every 24 hours based on CrCl <26ml/min and BMI > 30  DESCRIPTION: Pharmacy has adjusted enoxaparin dose per [facility] policy.  Patient is now receiving enoxaparin 40 mg every 24 hours    Tressie Ellis 11/02/2020 9:03 AM

## 2020-11-03 LAB — COMPREHENSIVE METABOLIC PANEL
ALT: 100 U/L — ABNORMAL HIGH (ref 0–44)
AST: 57 U/L — ABNORMAL HIGH (ref 15–41)
Albumin: 2.1 g/dL — ABNORMAL LOW (ref 3.5–5.0)
Alkaline Phosphatase: 42 U/L (ref 38–126)
Anion gap: 8 (ref 5–15)
BUN: 87 mg/dL — ABNORMAL HIGH (ref 8–23)
CO2: 26 mmol/L (ref 22–32)
Calcium: 7.6 mg/dL — ABNORMAL LOW (ref 8.9–10.3)
Chloride: 112 mmol/L — ABNORMAL HIGH (ref 98–111)
Creatinine, Ser: 2.61 mg/dL — ABNORMAL HIGH (ref 0.61–1.24)
GFR, Estimated: 25 mL/min — ABNORMAL LOW (ref >60)
Glucose, Bld: 255 mg/dL — ABNORMAL HIGH (ref 70–99)
Potassium: 3.8 mmol/L (ref 3.5–5.1)
Sodium: 146 mmol/L — ABNORMAL HIGH (ref 135–145)
Total Bilirubin: 0.5 mg/dL (ref 0.3–1.2)
Total Protein: 5.8 g/dL — ABNORMAL LOW (ref 6.5–8.1)

## 2020-11-03 LAB — CBC WITH DIFFERENTIAL/PLATELET
Abs Immature Granulocytes: 0.57 10*3/uL — ABNORMAL HIGH (ref 0.00–0.07)
Basophils Absolute: 0.1 10*3/uL (ref 0.0–0.1)
Basophils Relative: 1 %
Eosinophils Absolute: 0 10*3/uL (ref 0.0–0.5)
Eosinophils Relative: 0 %
HCT: 37.8 % — ABNORMAL LOW (ref 39.0–52.0)
Hemoglobin: 11.7 g/dL — ABNORMAL LOW (ref 13.0–17.0)
Immature Granulocytes: 3 %
Lymphocytes Relative: 8 %
Lymphs Abs: 1.4 10*3/uL (ref 0.7–4.0)
MCH: 26.9 pg (ref 26.0–34.0)
MCHC: 31 g/dL (ref 30.0–36.0)
MCV: 86.9 fL (ref 80.0–100.0)
Monocytes Absolute: 0.9 10*3/uL (ref 0.1–1.0)
Monocytes Relative: 5 %
Neutro Abs: 15.3 10*3/uL — ABNORMAL HIGH (ref 1.7–7.7)
Neutrophils Relative %: 83 %
Platelets: 233 10*3/uL (ref 150–400)
RBC: 4.35 MIL/uL (ref 4.22–5.81)
RDW: 14.1 % (ref 11.5–15.5)
Smear Review: NORMAL
WBC: 18.3 10*3/uL — ABNORMAL HIGH (ref 4.0–10.5)
nRBC: 0.1 % (ref 0.0–0.2)

## 2020-11-03 LAB — FIBRIN DERIVATIVES D-DIMER (ARMC ONLY): Fibrin derivatives D-dimer (ARMC): 1381.69 ng/mL (FEU) — ABNORMAL HIGH (ref 0.00–499.00)

## 2020-11-03 LAB — BLOOD GAS, ARTERIAL
Acid-Base Excess: 1.8 mmol/L (ref 0.0–2.0)
Bicarbonate: 27.7 mmol/L (ref 20.0–28.0)
FIO2: 0.5
MECHVT: 500 mL
Mechanical Rate: 24
O2 Saturation: 89.3 %
PEEP: 10 cmH2O
Patient temperature: 37
RATE: 24 resp/min
pCO2 arterial: 48 mmHg (ref 32.0–48.0)
pH, Arterial: 7.37 (ref 7.350–7.450)
pO2, Arterial: 59 mmHg — ABNORMAL LOW (ref 83.0–108.0)

## 2020-11-03 LAB — C-REACTIVE PROTEIN: CRP: 2.8 mg/dL — ABNORMAL HIGH (ref <1.0)

## 2020-11-03 LAB — GLUCOSE, CAPILLARY
Glucose-Capillary: 182 mg/dL — ABNORMAL HIGH (ref 70–99)
Glucose-Capillary: 235 mg/dL — ABNORMAL HIGH (ref 70–99)
Glucose-Capillary: 246 mg/dL — ABNORMAL HIGH (ref 70–99)

## 2020-11-03 LAB — PHOSPHORUS: Phosphorus: 2.2 mg/dL — ABNORMAL LOW (ref 2.5–4.6)

## 2020-11-03 LAB — FERRITIN: Ferritin: 525 ng/mL — ABNORMAL HIGH (ref 24–336)

## 2020-11-03 LAB — MAGNESIUM: Magnesium: 2.7 mg/dL — ABNORMAL HIGH (ref 1.7–2.4)

## 2020-11-03 MED ORDER — FREE WATER
120.0000 mL | Status: DC
  Administered 2020-11-03 – 2020-11-04 (×6): 120 mL

## 2020-11-03 MED ORDER — INSULIN GLARGINE 100 UNIT/ML ~~LOC~~ SOLN
15.0000 [IU] | Freq: Two times a day (BID) | SUBCUTANEOUS | Status: DC
  Administered 2020-11-03 – 2020-11-06 (×7): 15 [IU] via SUBCUTANEOUS
  Filled 2020-11-03 (×8): qty 0.15

## 2020-11-03 MED ORDER — INSULIN ASPART 100 UNIT/ML ~~LOC~~ SOLN
4.0000 [IU] | SUBCUTANEOUS | Status: DC
  Administered 2020-11-03 – 2020-11-06 (×19): 4 [IU] via SUBCUTANEOUS
  Filled 2020-11-03 (×20): qty 1

## 2020-11-03 MED ORDER — MIDODRINE HCL 5 MG PO TABS
10.0000 mg | ORAL_TABLET | Freq: Three times a day (TID) | ORAL | Status: DC
  Administered 2020-11-03 – 2020-11-04 (×4): 10 mg
  Filled 2020-11-03 (×5): qty 2

## 2020-11-03 MED ORDER — POTASSIUM PHOSPHATES 15 MMOLE/5ML IV SOLN
15.0000 mmol | Freq: Once | INTRAVENOUS | Status: AC
  Administered 2020-11-03: 15 mmol via INTRAVENOUS
  Filled 2020-11-03: qty 5

## 2020-11-03 NOTE — Progress Notes (Signed)
CRITICAL CARE NOTE  74 y.o.malewith medical history significant forMorbidobesity, hypertension,chronic bilateral lower extremity venous stasis on chronic Augmentin per PCP, presented to his PCP on 14-Nov-2020 for chief concerns of falling at home after tripping on the rugand feeling generalizedweakness.  He denies hitting his head and loss of consciousness.  At his PCP office, patient was found to be hypoxic with SPO2 of 88% on room air. He was placed on 2 L nasal cannula and per documentation his SPO2 increased to 94%. He was sent to the emergency department for further evaluation.  In the emergency department, patient tested positive for COVID-19 infection.Patient is not vaccinated for COVID-19 infection. Patient states that he has been tested for COVID-19 2 times in the past and always tested negative therefore he felt like he did not require Covid vaccination.   ED Course:Discussed patient with ED provider, patient is hypoxic secondary to COVID-19 infection.    12/6 admitted to ICU s/p cardiac arrest 12/6 severe resp failure ARDS from COVID now on vent, fio2 at 100% 12/7 CVL placed, ART line placed 12/7 PRONED FOR 16 hrs PF ratio 60 12/8 proned for 16 hrs 12/9 severe ARDS    CC  follow up respiratory failure  SUBJECTIVE Patient remains critically ill Prognosis is guarded  Vent Mode: PRVC FiO2 (%):  [50 %-60 %] 50 % Set Rate:  [24 bmp] 24 bmp Vt Set:  [500 mL] 500 mL PEEP:  [10 cmH20] 10 cmH20 Plateau Pressure:  [30 cmH20] 30 cmH20  CBC    Component Value Date/Time   WBC 18.3 (H) 11/03/2020 0345   RBC 4.35 11/03/2020 0345   HGB 11.7 (L) 11/03/2020 0345   HCT 37.8 (L) 11/03/2020 0345   PLT 233 11/03/2020 0345   MCV 86.9 11/03/2020 0345   MCH 26.9 11/03/2020 0345   MCHC 31.0 11/03/2020 0345   RDW 14.1 11/03/2020 0345   LYMPHSABS 1.4 11/03/2020 0345   MONOABS 0.9 11/03/2020 0345   EOSABS 0.0 11/03/2020 0345   BASOSABS 0.1 11/03/2020 0345   BMP  Latest Ref Rng & Units 11/03/2020 11/02/2020 11/01/2020  Glucose 70 - 99 mg/dL 161(W) 960(A) 540(J)  BUN 8 - 23 mg/dL 81(X) 91(Y) 78(G)  Creatinine 0.61 - 1.24 mg/dL 9.56(O) 1.30(Q) 6.57(Q)  Sodium 135 - 145 mmol/L 146(H) 144 140  Potassium 3.5 - 5.1 mmol/L 3.8 4.4 4.3  Chloride 98 - 111 mmol/L 112(H) 109 107  CO2 22 - 32 mmol/L 26 25 23   Calcium 8.9 - 10.3 mg/dL 7.6(L) 7.7(L) 8.0(L)    BP (!) 115/54   Pulse 69   Temp (!) 95.9 F (35.5 C)   Resp (!) 24   Ht 5' 2.99" (1.6 m)   Wt 113.4 kg   SpO2 (!) 87%   BMI 44.31 kg/m    I/O last 3 completed shifts: In: 3284.6 [I.V.:1224.1; NG/GT:1369.7; IV Piggyback:690.8] Out: 1100 [Urine:1100] No intake/output data recorded.  SpO2: (!) 87 % O2 Flow Rate (L/min): 55 L/min FiO2 (%): 50 %  Estimated body mass index is 44.31 kg/m as calculated from the following:   Height as of this encounter: 5' 2.99" (1.6 m).   Weight as of this encounter: 113.4 kg.  SIGNIFICANT EVENTS   REVIEW OF SYSTEMS  PATIENT IS UNABLE TO PROVIDE COMPLETE REVIEW OF SYSTEMS DUE TO SEVERE CRITICAL ILLNESS      COVID-19 DISASTER DECLARATION:   FULL CONTACT PHYSICAL EXAMINATION WAS NOT POSSIBLE DUE TO TREATMENT OF COVID-19  AND CONSERVATION OF PERSONAL PROTECTIVE EQUIPMENT, LIMITED EXAM FINDINGS INCLUDE-  PHYSICAL EXAMINATION:  GENERAL:critically ill appearing, +resp distress NEUROLOGIC: obtunded, GCS<8   Patient assessed or the symptoms described in the history of present illness.  In the context of the Global COVID-19 pandemic, which necessitated consideration that the patient might be at risk for infection with the SARS-CoV-2 virus that causes COVID-19, Institutional protocols and algorithms that pertain to the evaluation of patients at risk for COVID-19 are in a state of rapid change based on information released by regulatory bodies including the CDC and federal and state organizations. These policies and algorithms were followed during the patient's  care while in hospital.    MEDICATIONS: I have reviewed all medications and confirmed regimen as documented   CULTURE RESULTS   Recent Results (from the past 240 hour(s))  Resp Panel by RT-PCR (Flu A&B, Covid) Nasopharyngeal Swab     Status: Abnormal   Collection Time: 10/31/2020  4:14 PM   Specimen: Nasopharyngeal Swab; Nasopharyngeal(NP) swabs in vial transport medium  Result Value Ref Range Status   SARS Coronavirus 2 by RT PCR POSITIVE (A) NEGATIVE Final    Comment: RESULT CALLED TO, READ BACK BY AND VERIFIED WITH: LINDSEY BLACK AT 1720 11/24/2020.PMF (NOTE) SARS-CoV-2 target nucleic acids are DETECTED.  The SARS-CoV-2 RNA is generally detectable in upper respiratory specimens during the acute phase of infection. Positive results are indicative of the presence of the identified virus, but do not rule out bacterial infection or co-infection with other pathogens not detected by the test. Clinical correlation with patient history and other diagnostic information is necessary to determine patient infection status. The expected result is Negative.  Fact Sheet for Patients: BloggerCourse.comhttps://www.fda.gov/media/152166/download  Fact Sheet for Healthcare Providers: SeriousBroker.ithttps://www.fda.gov/media/152162/download  This test is not yet approved or cleared by the [place] FDA and  has been authorized for detection and/or diagnosis of SARS-CoV-2 by FDA under an Emergency Use Authorization (EUA).  This EUA will remain in effect (meaning this test can be  used) for the duration of  the COVID-19 declaration under Section 564(b)(1) of the Act, 21 U.S.C. section 360bbb-3(b)(1), unless the authorization is terminated or revoked sooner.     Influenza A by PCR NEGATIVE NEGATIVE Final   Influenza B by PCR NEGATIVE NEGATIVE Final    Comment: (NOTE) The Xpert Xpress SARS-CoV-2/FLU/RSV plus assay is intended as an aid in the diagnosis of influenza from Nasopharyngeal swab specimens and should not be used  as a sole basis for treatment. Nasal washings and aspirates are unacceptable for Xpert Xpress SARS-CoV-2/FLU/RSV testing.  Fact Sheet for Patients: BloggerCourse.comhttps://www.fda.gov/media/152166/download  Fact Sheet for Healthcare Providers: SeriousBroker.ithttps://www.fda.gov/media/152162/download  This test is not yet approved or cleared by the [place] FDA and has been authorized for detection and/or diagnosis of SARS-CoV-2 by FDA under an Emergency Use Authorization (EUA). This EUA will remain in effect (meaning this test can be used) for the duration of the COVID-19 declaration under Section 564(b)(1) of the Act, 21 U.S.C. section 360bbb-3(b)(1), unless the authorization is terminated or revoked.  Performed at [facility], [address] [place], [place] [id number]   Blood culture (routine x 2)     Status: None   Collection Time: 11/21/2020  8:42 PM   Specimen: BLOOD  Result Value Ref Range Status   Specimen Description BLOOD RIGHT FOREARM  Final   Special Requests   Final    BOTTLES DRAWN AEROBIC AND ANAEROBIC Blood Culture adequate volume   Culture   Final    NO GROWTH 5 DAYS Performed at [facility],  [address], [place], [place] [address]    Report Status 11/02/2020 FINAL  Final  Blood Culture (routine x 2)     Status: None   Collection Time: 11/06/2020  8:43 PM   Specimen: BLOOD  Result Value Ref Range Status   Specimen Description BLOOD LEFT FOREARM  Final   Special Requests   Final    BOTTLES DRAWN AEROBIC AND ANAEROBIC Blood Culture results may not be optimal due to an excessive volume of blood received in culture bottles   Culture   Final    NO GROWTH 5 DAYS Performed at [facility], [address] [place], [place] [address]    Report Status 11/02/2020 FINAL  Final  MRSA PCR Screening     Status: None   Collection Time: 10/29/20  6:50 AM   Specimen: Nasal Mucosa; Nasopharyngeal  Result Value Ref Range Status   MRSA by PCR NEGATIVE NEGATIVE Final     Comment:        The GeneXpert MRSA Assay (FDA approved for NASAL specimens only), is one component of a comprehensive MRSA colonization surveillance program. It is not intended to diagnose MRSA infection nor to guide or monitor treatment for MRSA infections. Performed at [facility], [address] [place], [place] [address]   MRSA PCR Screening     Status: None   Collection Time: 10/31/20  5:16 PM   Specimen: Nasal Mucosa; Nasopharyngeal  Result Value Ref Range Status   MRSA by PCR NEGATIVE NEGATIVE Final    Comment:        The GeneXpert MRSA Assay (FDA approved for NASAL specimens only), is one component of a comprehensive MRSA colonization surveillance program. It is not intended to diagnose MRSA infection nor to guide or monitor treatment for MRSA infections. Performed at [facility], [address] [place], [place] [address]           IMAGING    ECHOCARDIOGRAM COMPLETE  Result Date: 11/02/2020    ECHOCARDIOGRAM REPORT   Patient Name:   Jon Thomas Date of Exam: 11/02/2020 Medical Rec #:  [id number]          Height:       63.0 in Accession #:    [id number]         Weight:       250.1 lb Date of Birth:  November 02, 1946          BSA:          2.126 m Patient Age:    74 years           BP:           110/44 mmHg Patient Gender: M                  HR:           70 bpm. Exam Location:  [facility] Procedure: 2D Echo, Cardiac Doppler and Color Doppler Indications:     Cardiac arrest 146.9  History:         Patient has no prior history of Echocardiogram examinations.                  Risk Factors:Dyslipidemia and Hypertension.  Sonographer:     Cristela Blue RDCS (AE) Referring Phys:  [id number] Judithe Modest Diagnosing Phys: Harold Hedge MD  Sonographer Comments: Technically challenging study due to limited acoustic windows, no apical window, no subcostal window and echo performed with patient supine and on artificial respirator.  IMPRESSIONS  1. Left ventricular  ejection fraction, by estimation, is 55 to 60%. The left ventricle has normal function. The left ventricle has no regional wall motion abnormalities. There is mild left ventricular hypertrophy. Left ventricular diastolic parameters are consistent with Grade I diastolic dysfunction (impaired relaxation).  2. Right ventricular systolic function is normal. The right ventricular size is normal.  3. The mitral valve is grossly normal. Trivial mitral valve regurgitation.  4. The aortic valve is grossly normal. Aortic valve regurgitation is not visualized. FINDINGS  Left Ventricle: Left ventricular ejection fraction, by estimation, is 55 to 60%. The left ventricle has normal function. The left ventricle has no regional wall motion abnormalities. The left ventricular internal cavity size was normal in size. There is  mild left ventricular hypertrophy. Left ventricular diastolic parameters are consistent with Grade I diastolic dysfunction (impaired relaxation). Right Ventricle: The right ventricular size is normal. No increase in right ventricular wall thickness. Right ventricular systolic function is normal. Left Atrium: Left atrial size was normal in size. Right Atrium: Right atrial size was normal in size. Pericardium: There is no evidence of pericardial effusion. Mitral Valve: The mitral valve is grossly normal. Trivial mitral valve regurgitation. Tricuspid Valve: The tricuspid valve is grossly normal. Tricuspid valve regurgitation is trivial. Aortic Valve: The aortic valve is grossly normal. Aortic valve regurgitation is not visualized. Pulmonic Valve: The pulmonic valve was not well visualized. Pulmonic valve regurgitation is trivial. Aorta: The aortic root is normal in size and structure. IAS/Shunts: The atrial septum is grossly normal.  LEFT VENTRICLE PLAX 2D LVIDd:         3.81 cm LVIDs:         2.76 cm LV PW:         1.57 cm LV IVS:        1.44 cm LVOT diam:     2.00 cm LVOT Area:     3.14 cm  LEFT ATRIUM          Index LA diam:    3.20 cm 1.50 cm/m                        PULMONIC VALVE AORTA                 PV Vmax:        0.73 m/s Ao Root diam: 3.40 cm PV Peak grad:   2.1 mmHg                       RVOT Peak grad: 4 mmHg  TRICUSPID VALVE TR Peak grad:   7.5 mmHg TR Vmax:        137.00 cm/s  SHUNTS Systemic Diam: 2.00 cm Harold Hedge MD Electronically signed by Harold Hedge MD Signature Date/Time: 11/02/2020/12:29:58 PM    Final    Antibiotics Given (last 72 hours)    Date/Time Action Medication Dose Rate   10/31/20 1019 New Bag/Given   remdesivir 100 mg in sodium chloride 0.9 % 100 mL IVPB 100 mg 200 mL/hr   10/31/20 2307 New Bag/Given   cefTRIAXone (ROCEPHIN) 2 g in sodium chloride 0.9 % 100 mL IVPB 2 g 200 mL/hr   11/01/20 0007 New Bag/Given   azithromycin (ZITHROMAX) 500 mg in sodium chloride 0.9 % 250 mL IVPB 500 mg 250 mL/hr   11/01/20 1057 New Bag/Given   remdesivir 100 mg in sodium chloride 0.9 % 100 mL IVPB 100 mg 200 mL/hr  11/01/20 2109 New Bag/Given   cefTRIAXone (ROCEPHIN) 2 g in sodium chloride 0.9 % 100 mL IVPB 2 g 200 mL/hr   11/01/20 2156 New Bag/Given   azithromycin (ZITHROMAX) 500 mg in sodium chloride 0.9 % 250 mL IVPB 500 mg 260 mL/hr   11/02/20 2123 New Bag/Given   cefTRIAXone (ROCEPHIN) 2 g in sodium chloride 0.9 % 100 mL IVPB 2 g 200 mL/hr   11/02/20 2228 New Bag/Given   azithromycin (ZITHROMAX) 500 mg in sodium chloride 0.9 % 250 mL IVPB 500 mg 250 mL/hr       Nutrition Status: Nutrition Problem: Inadequate oral intake Etiology: inability to eat Signs/Symptoms: NPO status Interventions: Tube feeding,Prostat     Indwelling Urinary Catheter continued, requirement due to   Reason to continue Indwelling Urinary Catheter strict Intake/Output monitoring for hemodynamic instability   Central Line/ continued, requirement due to  Reason to continue [employer] of central venous pressure or other hemodynamic parameters and poor IV access   Ventilator  continued, requirement due to severe respiratory failure   Ventilator Sedation RASS 0 to -2      ASSESSMENT AND PLAN SYNOPSIS  Acute hypoxemic respiratory failure due to COVID-19 pneumonia / ARDS Mechanical ventilation via ARDS protocol, target PRVC 6 cc/kg Wean PEEP and FiO2 as able Goal plateau pressure less than 30, driving pressure less than 15 Paralytics if necessary for vent synchrony, gas exchange Cycle prone positioning if necessary for oxygenation Deep sedation per PAD protocol, goal RASS -4, currently fentanyl, midazolam Diuresis as blood pressure and renal function can tolerate, goal CVP 5-8.   diuresis as tolerated based on Kidney function VAP prevention order set Remdesivir  IV STEROIDS    Severe ACUTE Hypoxic and Hypercapnic Respiratory Failure -continue Full MV support -continue Bronchodilator Therapy -Wean Fio2 and PEEP as tolerated -VAP/VENT bundle implementation  ACUTE DIASTOLIC CARDIAC FAILURE-  -oxygen as needed -Lasix as tolerated -follow up cardiology recs   Morbid obesity, possible OSA.   Will certainly impact respiratory mechanics, ventilator weaning Suspect will need to consider additional PEEP  ACUTE KIDNEY INJURY/Renal Failure -continue Foley Catheter-assess need -Avoid nephrotoxic agents -Follow urine output, BMP -Ensure adequate renal perfusion, optimize oxygenation -Renal dose medications     NEUROLOGY Acute toxic metabolic encephalopathy, need for sedation Goal RASS -2 to -3  SHOCK-SEPSIS -use vasopressors to keep MAP>65  CARDIAC ICU monitoring  ID -continue IV abx as prescibed -follow up cultures  GI GI PROPHYLAXIS as indicated   DIET-->TF's as tolerated Constipation protocol as indicated  ENDO - will use ICU hypoglycemic\Hyperglycemia protocol if indicated     ELECTROLYTES -follow labs as needed -replace as needed -pharmacy consultation and following   DVT/GI PRX ordered and assessed TRANSFUSIONS AS  NEEDED MONITOR FSBS I Assessed the need for Labs I Assessed the need for Foley I Assessed the need for Central Venous Line Family Discussion when available I Assessed the need for Mobilization I made an Assessment of medications to be adjusted accordingly Safety Risk assessment completed   CASE DISCUSSED IN MULTIDISCIPLINARY ROUNDS WITH ICU TEAM  Critical Care Time devoted to patient care services described in this note is 55 minutes.   Overall, patient is critically ill, prognosis is guarded.  Patient with Multiorgan failure and at high risk for cardiac arrest and death.    Lucie Leather, M.D.  [facility]  Medical Director [facility] Medical Director [facility] Cardio-Pulmonary Department

## 2020-11-03 NOTE — Progress Notes (Signed)
Responds to pain. Pt VSS on levophed. Bear hugger intermittently for low temp. Proned at approximately 1000. Tolerating well. Tolerating tube feeds. Repositioned q2h with pillow/ wedge support. ETT and OG intact.

## 2020-11-03 NOTE — Consult Note (Signed)
PHARMACY CONSULT NOTE  Pharmacy Consult for Electrolyte Monitoring and Replacement   Recent Labs: Potassium (mmol/L)  Date Value  11/03/2020 3.8   Magnesium (mg/dL)  Date Value  02/58/5277 2.7 (H)   Calcium (mg/dL)  Date Value  82/42/3536 7.6 (L)   Albumin (g/dL)  Date Value  14/43/1540 2.1 (L)   Phosphorus (mg/dL)  Date Value  08/67/6195 2.2 (L)   Sodium (mmol/L)  Date Value  11/03/2020 146 (H)   Corrected calcium: 9.3 mg/dL  Assessment: Patient is a 74 y/o M with medical history including morbid obesity, hypertension, chronic bilateral lower extremity venous stasis who was admitted 12/3 with COVID-19 pneumonia. Hospital course complicated by respiratory arrest on 12/6 requiring intubation. Patient transferred to the ICU and remains intubated, sedated, and on mechanical ventilation. Pharmacy consulted to assist with electrolyte monitoring and replacement as indicated.   12/9: Labs notable for worsening Scr (1.24 >> 1.95 >> 2.5 >> 2.61), urine output improving, decreased blood pressure support. Tube feeds started 12/7.   Goal of Therapy:  Electrolytes within normal limits  Plan:  --K 3.8, Phos 2.2, provider has ordered IV potassium phosphate 15 mmol x 1 --Na 146, free water 120 mL q4h (720 mL/day) --No further electrolyte replacement warranted today --Follow-up electrolytes tomorrow AM  Tressie Ellis 11/03/2020 8:38 AM

## 2020-11-03 NOTE — Progress Notes (Signed)
Inpatient Diabetes Program Recommendations  AACE/ADA: New Consensus Statement on Inpatient Glycemic Control (2015)  Target Ranges:  Prepandial:   less than 140 mg/dL      Peak postprandial:   less than 180 mg/dL (1-2 hours)      Critically ill patients:  140 - 180 mg/dL   Lab Results  Component Value Date   GLUCAP 235 (H) 11/03/2020   HGBA1C 8.7 (H) 11/15/2020    Review of Glycemic ControlResults for Jon Thomas, Jon Thomas (MRN [id number]) as of 11/03/2020 10:35  Ref. Range 11/02/2020 16:06 11/02/2020 19:15 11/02/2020 23:19 11/03/2020 03:41 11/03/2020 08:03  Glucose-Capillary Latest Ref Range: 70 - 99 mg/dL 409 (H) 811 (H) 914 (H) 182 (H) 235 (H)   Diabetes history: DM 2 Outpatient Diabetes medications:  Metformin 500 mg q AM Current orders for Inpatient glycemic control:  Vital 65 cc/hr Novolog resistant q 4 hours Lantus 10 units bid Novolog 3 units q 4 hours Inpatient Diabetes Program Recommendations:    Consider increasing Lantus to 15 units bid and increase Novolog tube feed coverage to 4 units q 4 hours.   Thanks Beryl Meager, RN, BC-ADM Inpatient Diabetes Coordinator Pager [phone] (8a-5p)

## 2020-11-04 LAB — GLUCOSE, CAPILLARY
Glucose-Capillary: 189 mg/dL — ABNORMAL HIGH (ref 70–99)
Glucose-Capillary: 195 mg/dL — ABNORMAL HIGH (ref 70–99)
Glucose-Capillary: 209 mg/dL — ABNORMAL HIGH (ref 70–99)
Glucose-Capillary: 224 mg/dL — ABNORMAL HIGH (ref 70–99)
Glucose-Capillary: 239 mg/dL — ABNORMAL HIGH (ref 70–99)

## 2020-11-04 LAB — BASIC METABOLIC PANEL
Anion gap: 10 (ref 5–15)
BUN: 97 mg/dL — ABNORMAL HIGH (ref 8–23)
CO2: 26 mmol/L (ref 22–32)
Calcium: 7.8 mg/dL — ABNORMAL LOW (ref 8.9–10.3)
Chloride: 110 mmol/L (ref 98–111)
Creatinine, Ser: 2.37 mg/dL — ABNORMAL HIGH (ref 0.61–1.24)
GFR, Estimated: 28 mL/min — ABNORMAL LOW (ref >60)
Glucose, Bld: 287 mg/dL — ABNORMAL HIGH (ref 70–99)
Potassium: 4.4 mmol/L (ref 3.5–5.1)
Sodium: 146 mmol/L — ABNORMAL HIGH (ref 135–145)

## 2020-11-04 LAB — PHOSPHORUS: Phosphorus: 3.3 mg/dL (ref 2.5–4.6)

## 2020-11-04 MED ORDER — HYDROCORTISONE NA SUCCINATE PF 100 MG IJ SOLR
50.0000 mg | Freq: Two times a day (BID) | INTRAMUSCULAR | Status: DC
  Administered 2020-11-04 – 2020-11-08 (×8): 50 mg via INTRAVENOUS
  Filled 2020-11-04 (×9): qty 2

## 2020-11-04 MED ORDER — FREE WATER
160.0000 mL | Status: DC
  Administered 2020-11-04 – 2020-11-05 (×7): 160 mL

## 2020-11-04 NOTE — Progress Notes (Signed)
CRITICAL CARE NOTE  CC  follow up respiratory failure/vent dependent  SUBJECTIVE Patient remains critically ill, intubated/sedated/proned Prognosis is guarded     SIGNIFICANT EVENTS None  Proned this am    BP (!) 124/51   Pulse 90   Temp 99.68 F (37.6 C) (Esophageal)   Resp (!) 24   Ht 5' 2.99" (1.6 m)   Wt 116.3 kg   SpO2 93%   BMI 45.43 kg/m    REVIEW OF SYSTEMS  PATIENT IS UNABLE TO PROVIDE COMPLETE REVIEW OF SYSTEMS DUE TO SEVERE CRITICAL ILLNESS   PHYSICAL EXAMINATION:  GENERAL:critically ill appearing, no resp distress HEAD: Normocephalic, atraumatic.  EYES: Pupils equal, round, reactive to light.  No scleral icterus.  MOUTH: Moist mucosal membrane. NECK: Supple. No thyromegaly. No nodules. No JVD.  PULMONARY: NO rhonchi, or wheezing CARDIOVASCULAR: S1 and S2. Regular rate and rhythm. No murmurs, rubs, or gallops.  GASTROINTESTINAL: Soft, nontender, -distended. No masses. Positive bowel sounds. No hepatosplenomegaly.  MUSCULOSKELETAL: No swelling, clubbing, or edema.  NEUROLOGIC:sedated/intubated  SKIN:intact,warm,dry  INTAKE/OUTPUT  Intake/Output Summary (Last 24 hours) at 11/04/2020 1402 Last data filed at 11/04/2020 1222 Gross per 24 hour  Intake 3347.04 ml  Output 610 ml  Net 2737.04 ml    LABS  CBC Recent Labs  Lab 11/01/20 0333 11/02/20 0329 11/03/20 0345  WBC 22.0* 20.4* 18.3*  HGB 13.4 13.1 11.7*  HCT 43.5 40.6 37.8*  PLT 245 228 233   Coag's No results for input(s): APTT, INR in the last 168 hours. BMET Recent Labs  Lab 11/02/20 0329 11/03/20 0345 11/04/20 0410  NA 144 146* 146*  K 4.4 3.8 4.4  CL 109 112* 110  CO2 _0 BUN 79* 87* 97*  CREATININE 2.50* 2.61* 2.37*  GLUCOSE 277* 255* 287*   Electrolytes Recent Labs  Lab 11/01/20 0333 11/02/20 0329 11/03/20 0345 11/04/20 0410  CALCIUM 8.0* 7.7* 7.6* 7.8*  MG 2.8* 2.8* 2.7*  --   PHOS 1.8* 2.9 2.2* 3.3   Sepsis Markers Recent Labs  Lab  11/21/2020 2043 11/17/2020 2350 10/31/20 0425 11/01/20 0333  LATICACIDVEN 2.4* 0.9  --   --   PROCALCITON  --   --  1.13 0.95   ABG Recent Labs  Lab 10/31/20 1514 11/01/20 0438 11/03/20 0756  PHART 7.06* 7.35 7.37  PCO2ART 77* 51* 48  PO2ART 109* 68* 59*   Liver Enzymes Recent Labs  Lab 11/01/20 0333 11/02/20 0329 11/03/20 0345  AST 294* 109* 57*  ALT 225* 151* 100*  ALKPHOS 47 47 42  BILITOT 0.7 0.6 0.5  ALBUMIN 2.4* 2.4* 2.1*   Cardiac Enzymes No results for input(s): TROPONINI, PROBNP in the last 168 hours. Glucose Recent Labs  Lab 11/03/20 0341 11/03/20 0803 11/03/20 1545 11/04/20 0405 11/04/20 0807 11/04/20 1143  GLUCAP 182* 235* 246* 239* 224* 195*     Recent Results (from the past 240 hour(s))  Resp Panel by RT-PCR (Flu A&B, Covid) Nasopharyngeal Swab     Status: Abnormal   Collection Time: 11/17/2020  4:14 PM   Specimen: Nasopharyngeal Swab; Nasopharyngeal(NP) swabs in vial transport medium  Result Value Ref Range Status   SARS Coronavirus 2 by RT PCR POSITIVE (A) NEGATIVE Final    Comment: RESULT CALLED TO, READ BACK BY AND VERIFIED WITH: LINDSEY BLACK AT 1720 11/13/2020.PMF (NOTE) SARS-CoV-2 target nucleic acids are DETECTED.  The SARS-CoV-2 RNA is generally detectable in upper respiratory specimens during the acute phase of infection. Positive results are indicative of the presence of  the identified virus, but do not rule out bacterial infection or co-infection with other pathogens not detected by the test. Clinical correlation with patient history and other diagnostic information is necessary to determine patient infection status. The expected result is Negative.  Fact Sheet for Patients: EntrepreneurPulse.com.au  Fact Sheet for Healthcare Providers: IncredibleEmployment.be  This test is not yet approved or cleared by the [place] FDA and  has been authorized for detection and/or diagnosis of SARS-CoV-2  by FDA under an Emergency Use Authorization (EUA).  This EUA will remain in effect (meaning this test can be  used) for the duration of  the COVID-19 declaration under Section 564(b)(1) of the Act, 21 U.S.C. section 360bbb-3(b)(1), unless the authorization is terminated or revoked sooner.     Influenza A by PCR NEGATIVE NEGATIVE Final   Influenza B by PCR NEGATIVE NEGATIVE Final    Comment: (NOTE) The Xpert Xpress SARS-CoV-2/FLU/RSV plus assay is intended as an aid in the diagnosis of influenza from Nasopharyngeal swab specimens and should not be used as a sole basis for treatment. Nasal washings and aspirates are unacceptable for Xpert Xpress SARS-CoV-2/FLU/RSV testing.  Fact Sheet for Patients: EntrepreneurPulse.com.au  Fact Sheet for Healthcare Providers: IncredibleEmployment.be  This test is not yet approved or cleared by the [place] FDA and has been authorized for detection and/or diagnosis of SARS-CoV-2 by FDA under an Emergency Use Authorization (EUA). This EUA will remain in effect (meaning this test can be used) for the duration of the COVID-19 declaration under Section 564(b)(1) of the Act, 21 U.S.C. section 360bbb-3(b)(1), unless the authorization is terminated or revoked.  Performed at [facility], [address] [place], [place] [address]   Blood culture (routine x 2)     Status: None   Collection Time: 10/30/2020  8:42 PM   Specimen: BLOOD  Result Value Ref Range Status   Specimen Description BLOOD RIGHT FOREARM  Final   Special Requests   Final    BOTTLES DRAWN AEROBIC AND ANAEROBIC Blood Culture adequate volume   Culture   Final    NO GROWTH 5 DAYS Performed at [facility], [address] [place], [place] [address]    Report Status 11/02/2020 FINAL  Final  Blood Culture (routine x 2)     Status: None   Collection Time: 11/10/2020  8:43 PM   Specimen: BLOOD  Result Value Ref Range Status    Specimen Description BLOOD LEFT FOREARM  Final   Special Requests   Final    BOTTLES DRAWN AEROBIC AND ANAEROBIC Blood Culture results may not be optimal due to an excessive volume of blood received in culture bottles   Culture   Final    NO GROWTH 5 DAYS Performed at [facility], [address] [place], [place] [address]    Report Status 11/02/2020 FINAL  Final  MRSA PCR Screening     Status: None   Collection Time: 10/29/20  6:50 AM   Specimen: Nasal Mucosa; Nasopharyngeal  Result Value Ref Range Status   MRSA by PCR NEGATIVE NEGATIVE Final    Comment:        The GeneXpert MRSA Assay (FDA approved for NASAL specimens only), is one component of a comprehensive MRSA colonization surveillance program. It is not intended to diagnose MRSA infection nor to guide or monitor treatment for MRSA infections. Performed at [facility], [address] [place], [place] [address]   MRSA PCR Screening     Status: None  Collection Time: 10/31/20  5:16 PM   Specimen: Nasal Mucosa; Nasopharyngeal  Result Value Ref Range Status   MRSA by PCR NEGATIVE NEGATIVE Final    Comment:        The GeneXpert MRSA Assay (FDA approved for NASAL specimens only), is one component of a comprehensive MRSA colonization surveillance program. It is not intended to diagnose MRSA infection nor to guide or monitor treatment for MRSA infections. Performed at [facility], [address], [place], [place] [address]     MEDICATIONS   Current Facility-Administered Medications:  .  0.9 %  sodium chloride infusion, , Intravenous, PRN, Nicole Kindred A, DO, Last Rate: 5 mL/hr at 11/02/20 1600, Rate Verify at 11/02/20 1600 .  0.9 %  sodium chloride infusion, 250 mL, Intravenous, Continuous, Darel Hong D, NP, Last Rate: 5 mL/hr at 11/04/20 1222, Infusion Verify at 11/04/20 1222 .  acetaminophen (TYLENOL) 160 MG/5ML solution 325 mg, 325 mg, Per Tube, Q6H PRN, Flora Lipps, MD,  325 mg at 11/04/20 1120 .  ascorbic acid (VITAMIN C) tablet 500 mg, 500 mg, Per Tube, Daily, Flora Lipps, MD, 500 mg at 11/04/20 9702 .  cefTRIAXone (ROCEPHIN) 2 g in sodium chloride 0.9 % 100 mL IVPB, 2 g, Intravenous, Q24H, Flora Lipps, MD, Stopped at 11/03/20 2345 .  chlorhexidine gluconate (MEDLINE KIT) (PERIDEX) 0.12 % solution 15 mL, 15 mL, Mouth Rinse, BID, Kasa, Kurian, MD, 15 mL at 11/04/20 0808 .  Chlorhexidine Gluconate Cloth 2 % PADS 6 each, 6 each, Topical, Daily, Flora Lipps, MD, 6 each at 11/04/20 1053 .  chlorpheniramine-HYDROcodone (TUSSIONEX) 10-8 MG/5ML suspension 5 mL, 5 mL, Per Tube, Q12H PRN, Flora Lipps, MD .  docusate (COLACE) 50 MG/5ML liquid 100 mg, 100 mg, Per Tube, BID, Flora Lipps, MD, 100 mg at 11/04/20 0812 .  enoxaparin (LOVENOX) injection 40 mg, 40 mg, Subcutaneous, Q24H, Benita Gutter, RPH, 40 mg at 11/04/20 0813 .  feeding supplement (PROSource TF) liquid 45 mL, 45 mL, Per Tube, Daily, Flora Lipps, MD, 45 mL at 11/04/20 0812 .  feeding supplement (VITAL HIGH PROTEIN) liquid 1,000 mL, 1,000 mL, Per Tube, Continuous, Kasa, Kurian, MD, Last Rate: 65 mL/hr at 11/04/20 1222, Infusion Verify at 11/04/20 1222 .  fentaNYL (SUBLIMAZE) bolus via infusion 25 mcg, 25 mcg, Intravenous, Q15 min PRN, Mortimer Fries, Kurian, MD .  fentaNYL 2535mg in NS 2541m(1037mml) infusion-PREMIX, 25-200 mcg/hr, Intravenous, Continuous, Kasa, Kurian, MD, Last Rate: 20 mL/hr at 11/04/20 1222, 200 mcg/hr at 11/04/20 1222 .  free water 160 mL, 160 mL, Per Tube, Q4H, BasRosine DoorD, 160 mL at 11/04/20 1145 .  guaiFENesin-dextromethorphan (ROBITUSSIN DM) 100-10 MG/5ML syrup 10 mL, 10 mL, Per Tube, Q4H PRN, KasMortimer Friesurian, MD .  hydrocortisone sodium succinate (SOLU-CORTEF) 100 MG injection 50 mg, 50 mg, Intravenous, Q8H, Kasa, Kurian, MD, 50 mg at 11/04/20 0532 .  insulin aspart (novoLOG) injection 0-20 Units, 0-20 Units, Subcutaneous, Q4H, KasFlora LippsD, 11 Units at 11/04/20 1147 .  insulin  aspart (novoLOG) injection 4 Units, 4 Units, Subcutaneous, Q4H, KasFlora LippsD, 4 Units at 11/04/20 1147 .  insulin glargine (LANTUS) injection 15 Units, 15 Units, Subcutaneous, BID, KasFlora LippsD, 15 Units at 11/04/20 1052 .  Ipratropium-Albuterol (COMBIVENT) respimat 1 puff, 1 puff, Inhalation, Q6H, Griffith, Kelly A, DO, 1 puff at 10/31/20 1257 .  MEDLINE mouth rinse, 15 mL, Mouth Rinse, 10 times per day, KasFlora LippsD, 15 mL at 11/04/20 1146 .  midazolam (VERSED) 50 mg/50 mL (1 mg/mL)  premix infusion, 0.5-10 mg/hr, Intravenous, Continuous, Kasa, Kurian, MD, Last Rate: 8 mL/hr at 11/04/20 1222, 8 mg/hr at 11/04/20 1222 .  midazolam (VERSED) injection 1 mg, 1 mg, Intravenous, Q2H PRN, Flora Lipps, MD, 1 mg at 11/04/20 1121 .  midodrine (PROAMATINE) tablet 10 mg, 10 mg, Per Tube, TID WC, Kasa, Kurian, MD, 10 mg at 11/04/20 1146 .  norepinephrine (LEVOPHED) 16 mg in 235m premix infusion, 0-40 mcg/min, Intravenous, Titrated, KBradly Bienenstock NP, Stopped at 11/04/20 0246 .  ondansetron (ZOFRAN) tablet 4 mg, 4 mg, Oral, Q6H PRN **OR** ondansetron (ZOFRAN) injection 4 mg, 4 mg, Intravenous, Q6H PRN, Cox, Amy N, DO, 4 mg at 10/29/20 2311 .  pantoprazole sodium (PROTONIX) 40 mg/20 mL oral suspension 40 mg, 40 mg, Per Tube, Daily, KFlora Lipps MD, 40 mg at 11/04/20 0812 .  polyethylene glycol (MIRALAX / GLYCOLAX) packet 17 g, 17 g, Per Tube, Daily, KFlora Lipps MD, 17 g at 11/04/20 0812 .  vecuronium (NORCURON) injection 10 mg, 10 mg, Intravenous, Q1H PRN, KFlora Lipps MD, 10 mg at 11/04/20 1120 .  zinc sulfate capsule 220 mg, 220 mg, Per Tube, Daily, KFlora Lipps MD, 220 mg at 11/04/20 01027     Indwelling Urinary Catheter continued, requirement due to   Reason to continue Indwelling Urinary Catheter for strict Intake/Output monitoring for hemodynamic instability   Central Line continued, requirement due to   Reason to continue CKinder Morgan EnergyMonitoring of central venous pressure or  other hemodynamic parameters   Ventilator continued, requirement due to, resp failure    Ventilator Sedation RASS 0 to -2     ASSESSMENT AND PLAN SYNOPSIS  Acute hypoxemic respiratory failure due to COVID-19 pneumonia / ARDS Mechanical ventilation via ARDS protocol, target PRVC 6 cc/kg Wean PEEP and FiO2 as able Goal plateau pressure less than 30, driving pressure less than 15 Paralytics if necessary for vent synchrony, gas exchange Cycle prone positioning if necessary for oxygenation Deep sedation per PAD protocol, goal RASS -4, currentlyfentanyl, midazolam Diuresis as blood pressure and renal function can tolerate, goal CVP 5-8.  VAP prevention order set Remdesivir  IV STEROIDS , stress dose, taper     ACUTE DIASTOLIC CARDIAC FAILURE-  -oxygen as needed -Lasix as tolerated -follow up cardiology recs   Morbid obesity, possible OSA.  Will certainly impact respiratory mechanics, ventilator weaning Suspect will need to consider additional PEEP  ACUTE KIDNEY INJURY/Renal Failure, stable/better -continue Foley Catheter-assess need -Avoid nephrotoxic agents -Follow urine output, BMP -Ensure adequate renal perfusion, optimize oxygenation -Renal dose medications    NEUROLOGY Acute toxic metabolic encephalopathy, need for sedation Goal RASS -2 to -3  SHOCK-SEPSIS -use vasopressors to keep MAP>65, cont midodrine  CARDIAC ICU monitoring  ID -continue IV abx as prescibed, on ceftriaxone last dose today -follow up cultures  GI GI PROPHYLAXIS as indicated   DIET-->TF's as tolerated Constipation protocol as indicated  ENDO - will use ICU hypoglycemic\Hyperglycemia protocol if indicated    ELECTROLYTES -follow labs as needed -replace as needed -pharmacy consultation and following   DVT/GI PRX ordered and assessed TRANSFUSIONS AS NEEDED MONITOR FSBS I Assessed the need for Labs I Assessed the need for Foley I Assessed the need  for Central Venous Line Family Discussion when available I Assessed the need for Mobilization I made an Assessment of medications to be adjusted accordingly Safety Risk assessment completed   CASE DISCUSSED IN MULTIDISCIPLINARY ROUNDS WITH ICU TEAM  Critical Care Time devoted to patient care services described in this note is  35 minutes.   Overall, patient is critically ill, prognosis is guarded.  Patient with Multiorgan failure and at high risk for cardiac arrest and death.     Rosine Door, MD  2020-11-23 2:02 PM [facility]

## 2020-11-04 NOTE — Progress Notes (Signed)
Inpatient Diabetes Program Recommendations  AACE/ADA: New Consensus Statement on Inpatient Glycemic Control (2015)  Target Ranges:  Prepandial:   less than 140 mg/dL      Peak postprandial:   less than 180 mg/dL (1-2 hours)      Critically ill patients:  140 - 180 mg/dL   Lab Results  Component Value Date   GLUCAP 195 (H) 11/04/2020   HGBA1C 8.7 (H) 11/07/2020   Diabetes history:DM 2 Outpatient Diabetes medications: Metformin 500 mg q AM Current orders for Inpatient glycemic control: Vital 65 cc/hr Novolog resistant q 4 hours Lantus 15 units bid Novolog 4 units q 4 hours Inpatient Diabetes Program Recommendations:   Consider further increase of Lantus to 20 units bid and increase Novolog tube feed coverage to 5 units q 4 hours.  Thanks  Beryl Meager, RN, BC-ADM Inpatient Diabetes Coordinator Pager [phone] (8a-5p)

## 2020-11-04 NOTE — Progress Notes (Signed)
Pt proned at approximately 1100. Tolerating well. ETT and OG intact. Tolerating tube feeds. Foley put out very little output, flushed with 10 cc NS and 555 cc urine was returned. Cell phone and charger given to pts brother PJ per PJs request.

## 2020-11-04 NOTE — Consult Note (Signed)
PHARMACY CONSULT NOTE  Pharmacy Consult for Electrolyte Monitoring and Replacement   Recent Labs: Potassium (mmol/L)  Date Value  11/04/2020 4.4   Magnesium (mg/dL)  Date Value  77/41/4239 2.7 (H)   Calcium (mg/dL)  Date Value  53/20/2334 7.8 (L)   Albumin (g/dL)  Date Value  35/68/6168 2.1 (L)   Phosphorus (mg/dL)  Date Value  37/29/0211 3.3   Sodium (mmol/L)  Date Value  11/04/2020 146 (H)   Corrected calcium: 9.3 mg/dL  Assessment: Patient is a 74 y/o M with medical history including morbid obesity, hypertension, chronic bilateral lower extremity venous stasis who was admitted 12/3 with COVID-19 pneumonia. Hospital course complicated by respiratory arrest on 12/6 requiring intubation. Patient transferred to the ICU and remains intubated, sedated, and on mechanical ventilation. Pharmacy consulted to assist with electrolyte monitoring and replacement as indicated.   12/10: Labs notable for improving Scr (1.24 >> 1.95 >> 2.5 >> 2.61 >> 2.37), urine output stable (825 mL yesterday). Off pressors, continues on midodrine. Tube feeds started 12/7.   Goal of Therapy:  Electrolytes within normal limits  Plan:  --No electrolyte replacement warranted today --Na 146, free water 120 mL q4h (720 mL/day) --Follow-up electrolytes tomorrow AM  Tressie Ellis 11/04/2020 8:39 AM

## 2020-11-05 ENCOUNTER — Inpatient Hospital Stay: Payer: Medicare Other

## 2020-11-05 LAB — CBC
HCT: 39.9 % (ref 39.0–52.0)
Hemoglobin: 12 g/dL — ABNORMAL LOW (ref 13.0–17.0)
MCH: 27.1 pg (ref 26.0–34.0)
MCHC: 30.1 g/dL (ref 30.0–36.0)
MCV: 90.3 fL (ref 80.0–100.0)
Platelets: 271 10*3/uL (ref 150–400)
RBC: 4.42 MIL/uL (ref 4.22–5.81)
RDW: 14.8 % (ref 11.5–15.5)
WBC: 16.9 10*3/uL — ABNORMAL HIGH (ref 4.0–10.5)
nRBC: 0.1 % (ref 0.0–0.2)

## 2020-11-05 LAB — GLUCOSE, CAPILLARY
Glucose-Capillary: 118 mg/dL — ABNORMAL HIGH (ref 70–99)
Glucose-Capillary: 131 mg/dL — ABNORMAL HIGH (ref 70–99)
Glucose-Capillary: 181 mg/dL — ABNORMAL HIGH (ref 70–99)
Glucose-Capillary: 236 mg/dL — ABNORMAL HIGH (ref 70–99)

## 2020-11-05 LAB — BASIC METABOLIC PANEL
Anion gap: 10 (ref 5–15)
BUN: 98 mg/dL — ABNORMAL HIGH (ref 8–23)
CO2: 29 mmol/L (ref 22–32)
Calcium: 8.4 mg/dL — ABNORMAL LOW (ref 8.9–10.3)
Chloride: 109 mmol/L (ref 98–111)
Creatinine, Ser: 2.37 mg/dL — ABNORMAL HIGH (ref 0.61–1.24)
GFR, Estimated: 28 mL/min — ABNORMAL LOW (ref >60)
Glucose, Bld: 248 mg/dL — ABNORMAL HIGH (ref 70–99)
Potassium: 4.7 mmol/L (ref 3.5–5.1)
Sodium: 148 mmol/L — ABNORMAL HIGH (ref 135–145)

## 2020-11-05 LAB — MAGNESIUM: Magnesium: 2.7 mg/dL — ABNORMAL HIGH (ref 1.7–2.4)

## 2020-11-05 LAB — PHOSPHORUS: Phosphorus: 4.9 mg/dL — ABNORMAL HIGH (ref 2.5–4.6)

## 2020-11-05 MED ORDER — FREE WATER
200.0000 mL | Status: DC
  Administered 2020-11-05 – 2020-11-06 (×6): 200 mL

## 2020-11-05 NOTE — Progress Notes (Signed)
Trace  NAME:  Jon Thomas, MRN:  [id number], DOB:  10-20-1946, LOS: 8 ADMISSION DATE:  Nov 19, 2020, CONSULTATION DATE:  10/31/20 REFERRING MD:  Dr. Sedalia Muta, CHIEF COMPLAINT: Shortness of breath  Brief History   74 yo male admitted following cardiac arrest with acute hypoxic respiratory failure secondary to COVID-19 pneumonia and ARDS requiring mechanical intubation  Past Medical History  Hypertension Chronic bilateral lower extremity venous stasis -on chronic Augmentin per PCP Hyperlipidemia Morbid obesity  Significant Hospital Events   12/6 admitted to ICU s/p cardiac arrest 12/6 severe resp failure ARDS from COVID now on vent, fio2 at 100% 12/7 CVL placed, ART line placed 12/7 PRONED FOR 16 hrs PF ratio 60 12/8 proned for 16 hrs 12/9 severe ARDS 12/10 Patient remains critically ill, intubated/sedated/proned 12/11 patient requiring daily proning, increasing FiO2 requirements  Consults:    Procedures:  10/31/20 ETT >> 11/01/20 CVC 3 L >> 11/01/20 Arterial Line >>   Significant Diagnostic Tests:  November 19, 2020 CTa >> negative for PE, multifocal patchy airspace opacities throughout both lungs consistent with multifocal pneumonia. 10/30/20 US abdomen Limited RUQ >> mild sludge in gallbladder, probable hepatic steatosis 11/02/20 echocardiogram >> LVEF 55 to 60%, no wall motion abnormalities, mild LVH, grade 1 diastolic dysfunction, trivial mitral valve regurgitation, trivial pulmonic valve regurgitation.  All else WNL  Micro Data:  11-19-20 COVID-19 >> positive 11-19-20 influenza A/B >> negative 10/29/20 MRSA PCR >> negative Nov 19, 2020 blood cultures x2 >> negative Antimicrobials:  19-Nov-2020 cefepime x 1 10/30/20 ceftriaxone >>  10/30/20 azithromycin >> 11/03/20  Interim history/subjective:  Patient prone-increasing FiO2 support today to 60%, patient critical but stable on fentanyl and Versed drips. Off pressors Labs/ Imaging personally reviewed Net: +1 L (+9 L) Na+/ K+: 148/ 4.7 BUN/Cr.:  98/ 2.37 Hgb: 12.0  WBC/ TMAX: 16.9/ 37.3   CXR 11/05/20: Stable patchy bilateral airspace opacities consistent with multifocal pneumonia  Objective   Blood pressure (!) 136/52, pulse 96, temperature 98.1 F (36.7 C), temperature source Axillary, resp. rate (!) 24, height 5' 2.99" (1.6 m), weight 116.5 kg, SpO2 97 %.    Vent Mode: PRVC FiO2 (%):  [50 %-60 %] 60 % Set Rate:  [24 bmp] 24 bmp Vt Set:  [500 mL] 500 mL PEEP:  [10 cmH20] 10 cmH20 Plateau Pressure:  [23 cmH20-26 cmH20] 23 cmH20   Intake/Output Summary (Last 24 hours) at 11/05/2020 2244 Last data filed at 11/05/2020 2200 Gross per 24 hour  Intake 2819.38 ml  Output 1825 ml  Net 994.38 ml   Filed Weights   10/31/20 0305 11/04/20 0414 11/05/20 0500  Weight: 113.4 kg 116.3 kg 116.5 kg    Examination: General: Adult male, critically ill, lying in bed intubated & sedated requiring mechanical ventilation  HEENT: MM pink/moist, anicteric, atraumatic, neck supple Neuro: Sedated and prone unable to commands, PERRL-deferred as prone CV: s1s2 RRR, NSR on monitor, no r/m/g Pulm: Regular, non labored on PRVC 60%/PEEP 10, breath sounds diminished throughout GI: Deferred as prone GU: foley in place with clear yellow urine Skin:  no rashes/lesions noted Extremities: warm/dry, pulses + 2 R/P, trace edema noted  Resolved Hospital Problem list     Assessment & Plan:  Acute Hypoxic Respiratory Failure secondary to COVID-19 Bacterial Pneumonia  - Ventilator settings: PRVC 8 mL/kg, FiO2: 60%, PEEP 10 continue lung protective strategies  - Wean PEEP & FiO2 as tolerated to maintain O2 sats >88% - Goal plateau pressure < 30, driving pressure < 15 - Consider continuous paralytics if needed for vent synchrony/gas exchange -  Cycle prone positioning if P/F ratio < for oxygenation - Deep sedation per PAD protocol, goal RASS -4: Fentanyl & Midazolam drips - Vecuronium IVP Q 1h PRN for ventilator asynchrony - Diuresis as tolerated,  goal CVP 5-8.   - VAP prevention order set - Remdesivir, plan for 5 days -Continue stress dose steroids: Solu-Cortef 50 mg every 12 - Follow inflammatory markers: Ferritin, D-dimer, CRP, LDH - continue Vitamin C & zinc - f/u cultures, monitor WBC/ fever curve - Intermittent CXR & ABG PRN  Sepsis with septic shock in the setting of COVID-19 pneumonia Off pressors- 12/10 -Continuous cardiac monitoring -Monitor WBC/fever curve -Continue ceftriaxone -Levophed drip as needed to maintain MAP greater than 65 -Continue midodrine 3 times daily  Acute kidney injury Creatinine: 0.8- 09/05/20, current creatinine 2.37- 12/11 -Continue Foley catheter, assess need daily - Strict I/O's: alert provider if UOP < 0.5 mL/kg/hr - Daily BMP, replace electrolytes PRN - Avoid nephrotoxic agents as able, ensure adequate renal perfusion - Nephrology following, appreciate input  Type 2 diabetes mellitus Steroid-induced hyperglycemia  -CBG every 4 -SSI coverage -Follow ICU hypo/hyperglycemia protocol  Hypertension -Home regimen on hold in the setting of hypotension due to septic shock  Best practice (evaluated daily)  Diet: N.p.o. Pain/Anxiety/Delirium protocol (if indicated): Fentanyl and Versed drips VAP protocol (if indicated): Established DVT prophylaxis: Enoxaparin SQ GI prophylaxis: Protonix IV Glucose control: Monitor every 4, SSI Mobility: Bedrest, mobilize as tolerated last date of multidisciplinary goals of care discussion: 11/04/20 Family and staff present: updated via telephone by MD and primary care RN Summary of discussion: Treatment of severe ARDS with intermittent proning, continuing current plan of care Follow up goals of care discussion due: 11/06/20 Code Status: DNR Disposition: ICU  Labs   CBC: Recent Labs  Lab 10/30/20 0514 10/31/20 0425 11/01/20 0333 11/02/20 0329 11/03/20 0345 11/05/20 0518  WBC 15.1* 17.0* 22.0* 20.4* 18.3* 16.9*  NEUTROABS 11.6* 14.2* 19.0*  17.5* 15.3*  --   HGB 15.1 14.5 13.4 13.1 11.7* 12.0*  HCT 47.8 45.7 43.5 40.6 37.8* 39.9  MCV 85.2 85.4 86.1 84.9 86.9 90.3  PLT 218 215 245 228 233 271    Basic Metabolic Panel: Recent Labs  Lab 10/31/20 0425 11/01/20 0333 11/02/20 0329 11/03/20 0345 11/04/20 0410 11/05/20 0518  NA 138 140 144 146* 146* 148*  K 4.8 4.3 4.4 3.8 4.4 4.7  CL 105 107 109 112* 110 109  CO2 23 23 25 26 26 29   GLUCOSE 289* 219* 277* 255* 287* 248*  BUN 46* 59* 79* 87* 97* 98*  CREATININE 1.24 1.95* 2.50* 2.61* 2.37* 2.37*  CALCIUM 8.1* 8.0* 7.7* 7.6* 7.8* 8.4*  MG 2.7* 2.8* 2.8* 2.7*  --  2.7*  PHOS 2.3* 1.8* 2.9 2.2* 3.3 4.9*   GFR: Estimated Creatinine Clearance: 30.7 mL/min (A) (by C-G formula based on SCr of 2.37 mg/dL (H)). Recent Labs  Lab 10/31/20 0425 11/01/20 0333 11/02/20 0329 11/03/20 0345 11/05/20 0518  PROCALCITON 1.13 0.95  --   --   --   WBC 17.0* 22.0* 20.4* 18.3* 16.9*    Liver Function Tests: Recent Labs  Lab 10/30/20 0514 10/31/20 0425 11/01/20 0333 11/02/20 0329 11/03/20 0345  AST 338* 218* 294* 109* 57*  ALT 115* 106* 225* 151* 100*  ALKPHOS 37* 42 47 47 42  BILITOT 0.7 0.8 0.7 0.6 0.5  PROT 6.9 6.6 6.2* 6.2* 5.8*  ALBUMIN 2.6* 2.6* 2.4* 2.4* 2.1*   No results for input(s): LIPASE, AMYLASE in the last 168 hours. No  results for input(s): AMMONIA in the last 168 hours.  ABG    Component Value Date/Time   PHART 7.37 11/03/2020 0756   PCO2ART 48 11/03/2020 0756   PO2ART 59 (L) 11/03/2020 0756   HCO3 27.7 11/03/2020 0756   ACIDBASEDEF 10.2 (H) 10/31/2020 1514   O2SAT 89.3 11/03/2020 0756     Coagulation Profile: No results for input(s): INR, PROTIME in the last 168 hours.  Cardiac Enzymes: No results for input(s): CKTOTAL, CKMB, CKMBINDEX, TROPONINI in the last 168 hours.  HbA1C: Hgb A1c MFr Bld  Date/Time Value Ref Range Status  10/27/2020 08:43 PM 8.7 (H) 4.8 - 5.6 % Final    Comment:    (NOTE) Pre diabetes:          5.7%-6.4%  Diabetes:               >6.4%  Glycemic control for   <7.0% adults with diabetes   06/30/2016 05:57 AM 7.6 (H) 4.0 - 6.0 % Final    CBG: Recent Labs  Lab 11/04/20 1518 11/04/20 2342 11/05/20 0818 11/05/20 1206 11/05/20 1950  GLUCAP 189* 209* 181* 236* 131*     Critical care time: 55 minutes     Betsey Holiday, AGACNP-BC Acute Care Nurse Practitioner [facility]   [phone] / [phone] Please see Amion for pager details.

## 2020-11-05 NOTE — Progress Notes (Signed)
Head turned to r at 2040

## 2020-11-05 NOTE — Consult Note (Addendum)
PHARMACY CONSULT NOTE  Pharmacy Consult for Electrolyte Monitoring and Replacement   Recent Labs: Potassium (mmol/L)  Date Value  11/05/2020 4.7   Magnesium (mg/dL)  Date Value  82/42/3536 2.7 (H)   Calcium (mg/dL)  Date Value  14/43/1540 8.4 (L)   Albumin (g/dL)  Date Value  08/67/6195 2.1 (L)   Phosphorus (mg/dL)  Date Value  09/32/6712 4.9 (H)   Sodium (mmol/L)  Date Value  11/05/2020 148 (H)   Corrected calcium: 9.9 mg/dL  Assessment: Patient is a 74 y/o M with medical history including morbid obesity, hypertension, chronic bilateral lower extremity venous stasis who was admitted 12/3 with COVID-19 pneumonia. Hospital course complicated by respiratory arrest on 12/6 requiring intubation. Patient transferred to the ICU and remains intubated, sedated, and on mechanical ventilation. Pharmacy consulted to assist with electrolyte monitoring and replacement as indicated.   12/10: Labs notable for improving Scr (1.24 >> 1.95 >> 2.5 >> 2.61 >> 2.37>2.37), urine output stable. Off pressors, continues on midodrine. Tube feeds started 12/7.   Goal of Therapy:  Electrolytes within normal limits  Plan:  --No electrolyte replacement warranted today --Na 146>148, free water increased from 120 mL to160 mL q4h (960 mL/day) yesterday -- Free water increased to 200 mL q4h per MD discussion --Follow-up electrolytes tomorrow AM  Marty Heck 11/05/2020 12:35 PM

## 2020-11-06 DIAGNOSIS — L8990 Pressure ulcer of unspecified site, unspecified stage: Secondary | ICD-10-CM | POA: Insufficient documentation

## 2020-11-06 DIAGNOSIS — J9601 Acute respiratory failure with hypoxia: Secondary | ICD-10-CM

## 2020-11-06 DIAGNOSIS — N179 Acute kidney failure, unspecified: Secondary | ICD-10-CM

## 2020-11-06 LAB — CBC WITH DIFFERENTIAL/PLATELET
Abs Immature Granulocytes: 1.78 10*3/uL — ABNORMAL HIGH (ref 0.00–0.07)
Basophils Absolute: 0.1 10*3/uL (ref 0.0–0.1)
Basophils Relative: 1 %
Eosinophils Absolute: 0.1 10*3/uL (ref 0.0–0.5)
Eosinophils Relative: 1 %
HCT: 37.8 % — ABNORMAL LOW (ref 39.0–52.0)
Hemoglobin: 11.2 g/dL — ABNORMAL LOW (ref 13.0–17.0)
Immature Granulocytes: 11 %
Lymphocytes Relative: 6 %
Lymphs Abs: 1 10*3/uL (ref 0.7–4.0)
MCH: 26.8 pg (ref 26.0–34.0)
MCHC: 29.6 g/dL — ABNORMAL LOW (ref 30.0–36.0)
MCV: 90.4 fL (ref 80.0–100.0)
Monocytes Absolute: 0.5 10*3/uL (ref 0.1–1.0)
Monocytes Relative: 3 %
Neutro Abs: 12.5 10*3/uL — ABNORMAL HIGH (ref 1.7–7.7)
Neutrophils Relative %: 78 %
Platelets: 270 10*3/uL (ref 150–400)
RBC: 4.18 MIL/uL — ABNORMAL LOW (ref 4.22–5.81)
RDW: 14.8 % (ref 11.5–15.5)
Smear Review: NORMAL
WBC: 16 10*3/uL — ABNORMAL HIGH (ref 4.0–10.5)
nRBC: 0 % (ref 0.0–0.2)

## 2020-11-06 LAB — BASIC METABOLIC PANEL
Anion gap: 6 (ref 5–15)
BUN: 98 mg/dL — ABNORMAL HIGH (ref 8–23)
CO2: 31 mmol/L (ref 22–32)
Calcium: 8.4 mg/dL — ABNORMAL LOW (ref 8.9–10.3)
Chloride: 111 mmol/L (ref 98–111)
Creatinine, Ser: 2.18 mg/dL — ABNORMAL HIGH (ref 0.61–1.24)
GFR, Estimated: 31 mL/min — ABNORMAL LOW (ref >60)
Glucose, Bld: 195 mg/dL — ABNORMAL HIGH (ref 70–99)
Potassium: 5.1 mmol/L (ref 3.5–5.1)
Sodium: 148 mmol/L — ABNORMAL HIGH (ref 135–145)

## 2020-11-06 LAB — GLUCOSE, CAPILLARY
Glucose-Capillary: 290 mg/dL — ABNORMAL HIGH (ref 70–99)
Glucose-Capillary: 247 mg/dL — ABNORMAL HIGH (ref 70–99)
Glucose-Capillary: 262 mg/dL — ABNORMAL HIGH (ref 70–99)
Glucose-Capillary: 155 mg/dL — ABNORMAL HIGH (ref 70–99)
Glucose-Capillary: 211 mg/dL — ABNORMAL HIGH (ref 70–99)
Glucose-Capillary: 187 mg/dL — ABNORMAL HIGH (ref 70–99)
Glucose-Capillary: 173 mg/dL — ABNORMAL HIGH (ref 70–99)
Glucose-Capillary: 179 mg/dL — ABNORMAL HIGH (ref 70–99)
Glucose-Capillary: 245 mg/dL — ABNORMAL HIGH (ref 70–99)
Glucose-Capillary: 203 mg/dL — ABNORMAL HIGH (ref 70–99)
Glucose-Capillary: 221 mg/dL — ABNORMAL HIGH (ref 70–99)

## 2020-11-06 LAB — PHOSPHORUS: Phosphorus: 4.1 mg/dL (ref 2.5–4.6)

## 2020-11-06 LAB — MAGNESIUM: Magnesium: 2.7 mg/dL — ABNORMAL HIGH (ref 1.7–2.4)

## 2020-11-06 MED ORDER — FREE WATER
250.0000 mL | Status: DC
  Administered 2020-11-06 – 2020-11-08 (×12): 250 mL

## 2020-11-06 MED ORDER — INSULIN ASPART 100 UNIT/ML ~~LOC~~ SOLN
5.0000 [IU] | SUBCUTANEOUS | Status: DC
  Administered 2020-11-07 – 2020-11-08 (×10): 5 [IU] via SUBCUTANEOUS
  Filled 2020-11-06 (×9): qty 1

## 2020-11-06 MED ORDER — FUROSEMIDE 10 MG/ML IJ SOLN
20.0000 mg | Freq: Once | INTRAMUSCULAR | Status: AC
  Administered 2020-11-06: 14:00:00 20 mg via INTRAVENOUS
  Filled 2020-11-06: qty 2

## 2020-11-06 MED ORDER — ENOXAPARIN SODIUM 60 MG/0.6ML ~~LOC~~ SOLN
0.5000 mg/kg | SUBCUTANEOUS | Status: DC
  Administered 2020-11-07 – 2020-11-08 (×2): 57.5 mg via SUBCUTANEOUS
  Filled 2020-11-06 (×2): qty 0.6

## 2020-11-06 MED ORDER — INSULIN GLARGINE 100 UNIT/ML ~~LOC~~ SOLN
20.0000 [IU] | Freq: Two times a day (BID) | SUBCUTANEOUS | Status: DC
  Administered 2020-11-07 – 2020-11-08 (×3): 20 [IU] via SUBCUTANEOUS
  Filled 2020-11-06 (×5): qty 0.2

## 2020-11-06 NOTE — Consult Note (Signed)
PHARMACY CONSULT NOTE  Pharmacy Consult for Electrolyte Monitoring and Replacement   Recent Labs: Potassium (mmol/L)  Date Value  11/06/2020 5.1   Magnesium (mg/dL)  Date Value  22/48/2500 2.7 (H)   Calcium (mg/dL)  Date Value  37/02/8888 8.4 (L)   Albumin (g/dL)  Date Value  16/94/5038 2.1 (L)   Phosphorus (mg/dL)  Date Value  88/28/0034 4.1   Sodium (mmol/L)  Date Value  11/06/2020 148 (H)   Corrected calcium: 9.9 mg/dL  Assessment: Patient is a 74 y/o M with medical history including morbid obesity, hypertension, chronic bilateral lower extremity venous stasis who was admitted 12/3 with COVID-19 pneumonia. Hospital course complicated by respiratory arrest on 12/6 requiring intubation. Patient transferred to the ICU and remains intubated, sedated, and on mechanical ventilation. Pharmacy consulted to assist with electrolyte monitoring and replacement as indicated.   12/10: Labs notable for improving Scr (1.24 >> 1.95 >> 2.5 >> 2.61 >> 2.37>2.37), urine output stable. Off pressors, continues on midodrine. Tube feeds started 12/7.   Goal of Therapy:  Electrolytes within normal limits  Plan:  K 5.1  Mag 2.7  Phos 4.1  Scr 2.18 --No electrolyte replacement warranted today --Na 917>915>056,-- Free water increased today from to 250 mL q4h per MD --Follow-up electrolytes tomorrow AM  Robi Mitter A 11/06/2020 12:51 PM

## 2020-11-06 NOTE — Progress Notes (Signed)
PHARMACIST - PHYSICIAN COMMUNICATION  CONCERNING:  Enoxaparin (Lovenox) for DVT Prophylaxis    RECOMMENDATION: Patient prescribed enoxaparin 40mg  q24 hours for VTE prophylaxis.   Filed Weights   11/04/20 0414 11/05/20 0500 11/06/20 0143  Weight: 116.3 kg (256 lb 6.3 oz) 116.5 kg (256 lb 13.4 oz) 116.7 kg (257 lb 4.4 oz)    Body mass index is 45.59 kg/m.  Estimated Creatinine Clearance: 33.5 mL/min (A) (by C-G formula based on SCr of 2.18 mg/dL (H)).   Patient is candidate for enoxaparin 57.5 mg every 24 hours based on CrCl now >90ml/min and BMI > 30  DESCRIPTION: Pharmacy has adjusted enoxaparin dose per [facility] policy.  Patient is now receiving enoxaparin 57.5  mg every 24 hours    Jon Thomas A 11/06/2020 12:50 PM

## 2020-11-06 NOTE — Progress Notes (Signed)
CRITICAL CARE NOTE  CC  follow up respiratory failure COVID-19 pneumonia  SUBJECTIVE Remains intubated and sedated.  He is firing high FiO2.  No fever nausea vomiting diarrhea reported. Patient remains critically ill Prognosis is guarded     SIGNIFICANT EVENTS    BP (!) 158/60   Pulse (!) 108   Temp 97.88 F (36.6 C)   Resp (!) 24   Ht 5' 2.99" (1.6 m)   Wt 116.7 kg   SpO2 94%   BMI 45.59 kg/m    REVIEW OF SYSTEMS  PATIENT IS UNABLE TO PROVIDE COMPLETE REVIEW OF SYSTEMS DUE TO SEVERE CRITICAL ILLNESS   PHYSICAL EXAMINATION:  GENERAL:critically ill appearing, noresp distress HEAD: Normocephalic, atraumatic.  EYES: Pupils equal, round, reactive to light.  No scleral icterus.  MOUTH: Moist mucosal membrane. NECK: Supple. No thyromegaly. No nodules. No JVD.  PULMONARY: Bilateral few rhonchi without wheezing decreased breath sounds at bases CARDIOVASCULAR: S1 and S2. Regular rate and rhythm. No murmurs, rubs, or gallops.  GASTROINTESTINAL: Soft, nontender, -distended. No masses. Positive bowel sounds. No hepatosplenomegaly.  MUSCULOSKELETAL: No swelling, clubbing, or edema.  NEUROLOGIC: Sedated intubated SKIN:intact,warm,dry  INTAKE/OUTPUT  Intake/Output Summary (Last 24 hours) at 11/06/2020 1230 Last data filed at 11/06/2020 1000 Gross per 24 hour  Intake 3079.72 ml  Output 1575 ml  Net 1504.72 ml    LABS  CBC Recent Labs  Lab 11/03/20 0345 11/05/20 0518 11/06/20 0300  WBC 18.3* 16.9* 16.0*  HGB 11.7* 12.0* 11.2*  HCT 37.8* 39.9 37.8*  PLT 233 271 270   Coag's No results for input(s): APTT, INR in the last 168 hours. BMET Recent Labs  Lab 11/04/20 0410 11/05/20 0518 11/06/20 0300  NA 146* 148* 148*  K 4.4 4.7 5.1  CL 110 109 111  CO2 26 29 31   BUN 97* 98* 98*  CREATININE 2.37* 2.37* 2.18*  GLUCOSE 287* 248* 195*   Electrolytes Recent Labs  Lab 11/03/20 0345 11/04/20 0410 11/05/20 0518 11/06/20 0300  CALCIUM 7.6* 7.8* 8.4* 8.4*   MG 2.7*  --  2.7* 2.7*  PHOS 2.2* 3.3 4.9* 4.1   Sepsis Markers Recent Labs  Lab 10/31/20 0425 11/01/20 0333  PROCALCITON 1.13 0.95   ABG Recent Labs  Lab 10/31/20 1514 11/01/20 0438 11/03/20 0756  PHART 7.06* 7.35 7.37  PCO2ART 77* 51* 48  PO2ART 109* 68* 59*   Liver Enzymes Recent Labs  Lab 11/01/20 0333 11/02/20 0329 11/03/20 0345  AST 294* 109* 57*  ALT 225* 151* 100*  ALKPHOS 47 47 42  BILITOT 0.7 0.6 0.5  ALBUMIN 2.4* 2.4* 2.1*   Cardiac Enzymes No results for input(s): TROPONINI, PROBNP in the last 168 hours. Glucose Recent Labs  Lab 11/04/20 2342 11/05/20 0818 11/05/20 1206 11/05/20 1950 11/05/20 2355 11/06/20 0828  GLUCAP 209* 181* 236* 131* 118* 179*     Recent Results (from the past 240 hour(s))  Resp Panel by RT-PCR (Flu A&B, Covid) Nasopharyngeal Swab     Status: Abnormal   Collection Time: 10/31/2020  4:14 PM   Specimen: Nasopharyngeal Swab; Nasopharyngeal(NP) swabs in vial transport medium  Result Value Ref Range Status   SARS Coronavirus 2 by RT PCR POSITIVE (A) NEGATIVE Final    Comment: RESULT CALLED TO, READ BACK BY AND VERIFIED WITH: LINDSEY BLACK AT 1720 11/14/2020.PMF (NOTE) SARS-CoV-2 target nucleic acids are DETECTED.  The SARS-CoV-2 RNA is generally detectable in upper respiratory specimens during the acute phase of infection. Positive results are indicative of the presence of the identified  virus, but do not rule out bacterial infection or co-infection with other pathogens not detected by the test. Clinical correlation with patient history and other diagnostic information is necessary to determine patient infection status. The expected result is Negative.  Fact Sheet for Patients: EntrepreneurPulse.com.au  Fact Sheet for Healthcare Providers: IncredibleEmployment.be  This test is not yet approved or cleared by the [place] FDA and  has been authorized for detection and/or diagnosis  of SARS-CoV-2 by FDA under an Emergency Use Authorization (EUA).  This EUA will remain in effect (meaning this test can be  used) for the duration of  the COVID-19 declaration under Section 564(b)(1) of the Act, 21 U.S.C. section 360bbb-3(b)(1), unless the authorization is terminated or revoked sooner.     Influenza A by PCR NEGATIVE NEGATIVE Final   Influenza B by PCR NEGATIVE NEGATIVE Final    Comment: (NOTE) The Xpert Xpress SARS-CoV-2/FLU/RSV plus assay is intended as an aid in the diagnosis of influenza from Nasopharyngeal swab specimens and should not be used as a sole basis for treatment. Nasal washings and aspirates are unacceptable for Xpert Xpress SARS-CoV-2/FLU/RSV testing.  Fact Sheet for Patients: EntrepreneurPulse.com.au  Fact Sheet for Healthcare Providers: IncredibleEmployment.be  This test is not yet approved or cleared by the [place] FDA and has been authorized for detection and/or diagnosis of SARS-CoV-2 by FDA under an Emergency Use Authorization (EUA). This EUA will remain in effect (meaning this test can be used) for the duration of the COVID-19 declaration under Section 564(b)(1) of the Act, 21 U.S.C. section 360bbb-3(b)(1), unless the authorization is terminated or revoked.  Performed at [facility], [address] [place], [place] [address]   Blood culture (routine x 2)     Status: None   Collection Time: 11/21/2020  8:42 PM   Specimen: BLOOD  Result Value Ref Range Status   Specimen Description BLOOD RIGHT FOREARM  Final   Special Requests   Final    BOTTLES DRAWN AEROBIC AND ANAEROBIC Blood Culture adequate volume   Culture   Final    NO GROWTH 5 DAYS Performed at [facility], [address] [place], [place] [address]    Report Status 11/02/2020 FINAL  Final  Blood Culture (routine x 2)     Status: None   Collection Time: 10/27/2020  8:43 PM   Specimen: BLOOD  Result Value Ref  Range Status   Specimen Description BLOOD LEFT FOREARM  Final   Special Requests   Final    BOTTLES DRAWN AEROBIC AND ANAEROBIC Blood Culture results may not be optimal due to an excessive volume of blood received in culture bottles   Culture   Final    NO GROWTH 5 DAYS Performed at [facility], [address] [place], [place] [address]    Report Status 11/02/2020 FINAL  Final  MRSA PCR Screening     Status: None   Collection Time: 10/29/20  6:50 AM   Specimen: Nasal Mucosa; Nasopharyngeal  Result Value Ref Range Status   MRSA by PCR NEGATIVE NEGATIVE Final    Comment:        The GeneXpert MRSA Assay (FDA approved for NASAL specimens only), is one component of a comprehensive MRSA colonization surveillance program. It is not intended to diagnose MRSA infection nor to guide or monitor treatment for MRSA infections. Performed at [facility], [address] [place],  [address]   MRSA PCR Screening     Status: None   Collection Time:  10/31/20  5:16 PM   Specimen: Nasal Mucosa; Nasopharyngeal  Result Value Ref Range Status   MRSA by PCR NEGATIVE NEGATIVE Final    Comment:        The GeneXpert MRSA Assay (FDA approved for NASAL specimens only), is one component of a comprehensive MRSA colonization surveillance program. It is not intended to diagnose MRSA infection nor to guide or monitor treatment for MRSA infections. Performed at [facility], [address] [place], [place] [address]     MEDICATIONS   Current Facility-Administered Medications:  .  0.9 %  sodium chloride infusion, , Intravenous, PRN, Ezekiel Slocumb, DO, Last Rate: 5 mL/hr at 11/06/20 0600, Infusion Verify at 11/06/20 0600 .  0.9 %  sodium chloride infusion, 250 mL, Intravenous, Continuous, Bradly Bienenstock, NP, Stopped at 11/04/20 2355 .  acetaminophen (TYLENOL) 160 MG/5ML solution 325 mg, 325 mg, Per Tube, Q6H PRN, Flora Lipps, MD, 325 mg at 11/04/20  1120 .  ascorbic acid (VITAMIN C) tablet 500 mg, 500 mg, Per Tube, Daily, Flora Lipps, MD, 500 mg at 11/06/20 0929 .  chlorhexidine gluconate (MEDLINE KIT) (PERIDEX) 0.12 % solution 15 mL, 15 mL, Mouth Rinse, BID, Kasa, Kurian, MD, 15 mL at 11/06/20 0831 .  Chlorhexidine Gluconate Cloth 2 % PADS 6 each, 6 each, Topical, Daily, Flora Lipps, MD, 6 each at 11/06/20 0931 .  chlorpheniramine-HYDROcodone (TUSSIONEX) 10-8 MG/5ML suspension 5 mL, 5 mL, Per Tube, Q12H PRN, Flora Lipps, MD .  docusate (COLACE) 50 MG/5ML liquid 100 mg, 100 mg, Per Tube, BID, Flora Lipps, MD, 100 mg at 11/05/20 2100 .  enoxaparin (LOVENOX) injection 40 mg, 40 mg, Subcutaneous, Q24H, Benita Gutter, RPH, 40 mg at 11/06/20 0929 .  feeding supplement (PROSource TF) liquid 45 mL, 45 mL, Per Tube, Daily, Flora Lipps, MD, 45 mL at 11/06/20 0929 .  feeding supplement (VITAL HIGH PROTEIN) liquid 1,000 mL, 1,000 mL, Per Tube, Continuous, Kasa, Kurian, MD, Last Rate: 65 mL/hr at 11/06/20 0600, Infusion Verify at 11/06/20 0600 .  fentaNYL (SUBLIMAZE) bolus via infusion 25 mcg, 25 mcg, Intravenous, Q15 min PRN, Flora Lipps, MD, 25 mcg at 11/05/20 0044 .  fentaNYL 2590mg in NS 2574m(1062mml) infusion-PREMIX, 25-200 mcg/hr, Intravenous, Continuous, Kasa, Kurian, MD, Last Rate: 20 mL/hr at 11/06/20 0932, 200 mcg/hr at 11/06/20 0932 .  free water 200 mL, 200 mL, Per Tube, Q4H, BasRosine DoorD, 200 mL at 11/06/20 1206 .  guaiFENesin-dextromethorphan (ROBITUSSIN DM) 100-10 MG/5ML syrup 10 mL, 10 mL, Per Tube, Q4H PRN, KasMortimer Friesurian, MD .  hydrocortisone sodium succinate (SOLU-CORTEF) 100 MG injection 50 mg, 50 mg, Intravenous, Q12H, BasRosine DoorD, 50 mg at 11/06/20 0500 .  insulin aspart (novoLOG) injection 0-20 Units, 0-20 Units, Subcutaneous, Q4H, KasFlora LippsD, 7 Units at 11/06/20 1206 .  insulin aspart (novoLOG) injection 4 Units, 4 Units, Subcutaneous, Q4H, KasFlora LippsD, 4 Units at 11/06/20 1205 .  insulin glargine  (LANTUS) injection 15 Units, 15 Units, Subcutaneous, BID, KasFlora LippsD, 15 Units at 11/06/20 0931 .  Ipratropium-Albuterol (COMBIVENT) respimat 1 puff, 1 puff, Inhalation, Q6H, Griffith, Kelly A, DO, 1 puff at 10/31/20 1257 .  MEDLINE mouth rinse, 15 mL, Mouth Rinse, 10 times per day, KasFlora LippsD, 15 mL at 11/06/20 1205 .  midazolam (VERSED) 50 mg/50 mL (1 mg/mL) premix infusion, 0.5-10 mg/hr, Intravenous, Continuous, Kasa, Kurian, MD, Last Rate: 10 mL/hr at 11/06/20 0854, 10 mg/hr at 11/06/20 0854 .  midazolam (VERSED) injection 1 mg, 1 mg,  Intravenous, Q2H PRN, Flora Lipps, MD, 1 mg at 11/04/20 1121 .  midodrine (PROAMATINE) tablet 10 mg, 10 mg, Per Tube, TID WC, Kasa, Kurian, MD, 10 mg at 11/04/20 1821 .  norepinephrine (LEVOPHED) 16 mg in 230m premix infusion, 0-40 mcg/min, Intravenous, Titrated, KBradly Bienenstock NP, Stopped at 11/04/20 0246 .  ondansetron (ZOFRAN) tablet 4 mg, 4 mg, Oral, Q6H PRN **OR** ondansetron (ZOFRAN) injection 4 mg, 4 mg, Intravenous, Q6H PRN, Cox, Amy N, DO, 4 mg at 10/29/20 2311 .  pantoprazole sodium (PROTONIX) 40 mg/20 mL oral suspension 40 mg, 40 mg, Per Tube, Daily, KFlora Lipps MD, 40 mg at 11/06/20 0929 .  polyethylene glycol (MIRALAX / GLYCOLAX) packet 17 g, 17 g, Per Tube, Daily, Kasa, Kurian, MD, 17 g at 11/05/20 1032 .  vecuronium (NORCURON) injection 10 mg, 10 mg, Intravenous, Q1H PRN, KFlora Lipps MD, 10 mg at 11/06/20 0430 .  zinc sulfate capsule 220 mg, 220 mg, Per Tube, Daily, KMortimer Fries Kurian, MD, 220 mg at 11/06/20 0930      Indwelling Urinary Catheter continued, requirement due to   Reason to continue Indwelling Urinary Catheter for strict Intake/Output monitoring for hemodynamic instability   Central Line continued, requirement due to   Reason to continue CKinder Morgan EnergyMonitoring of central venous pressure or other hemodynamic parameters   Ventilator continued, requirement due to, resp failure    Ventilator Sedation RASS 0 to -2      ASSESSMENT AND PLAN SYNOPSIS  Acute Hypoxic Respiratory Failure secondary to COVID-19 Bacterial Pneumonia  - Ventilator settings: PRVC 8 mL/kg, FiO2: 70, PEEP 13 continue lung protective strategies  - Wean PEEP & FiO2 as tolerated to maintain O2 sats >88% - Goal plateau pressure < 30, driving pressure < 15 - Consider continuous paralytics if needed for vent synchrony/gas exchange - Cycle prone positioning if P/F ratio < for oxygenation - Deep sedation per PAD protocol, goal RASS -4: Fentanyl & Midazolam drips - Vecuronium IVP Q 1h PRN for ventilator asynchrony - Diuresis as tolerated, goal CVP 5-8.  - VAP prevention order set - Remdesivir, plan for 5 days completed  -Continue stress dose steroids: Solu-Cortef 50 mg every 12 - Follow inflammatory markers: Ferritin, D-dimer, CRP, LDH - continue Vitamin C & zinc - f/u cultures, monitor WBC/ fever curve - Intermittent CXR & ABG PRN  Sepsis with septic shock in the setting of COVID-19 pneumonia Off pressors- 12/10 -Continuous cardiac monitoring -Monitor WBC/fever curve  ceftriaxone completed  -Levophed drip as needed to maintain MAP greater than 65 -stop  midodrine   Acute kidney injury Creatinine: 0.8- 09/05/20, current creatinine 2.18- 12/12 , trending down  -Continue Foley catheter, assess need daily - Strict I/O's: alert provider if UOP < 0.5 mL/kg/hr - Daily BMP, replace electrolytes PRN - Avoid nephrotoxic agents as able, ensure adequate renal perfusion - Nephrology following, appreciate input  Type 2 diabetes mellitus Steroid-induced hyperglycemia  -CBG every 4 -SSI coverage -Follow ICU hypo/hyperglycemia protocol  Hypertension -Home regimen on hold in the setting of hypotension due to septic shock  Best practice (evaluated daily)  Diet: N.p.o. Pain/Anxiety/Delirium protocol (if indicated): Fentanyl and Versed drips VAP protocol (if indicated): Established DVT prophylaxis: Enoxaparin SQ GI  prophylaxis: Protonix IV Glucose control: Monitor every 4, SSI Mobility: Bedrest, mobilize as tolerated last date of multidisciplinary goals of care discussion: 11/04/20 Family and staff present: updated via telephone  Summary of discussion: Treatment of severe ARDS with intermittent proning, continuing current plan of care Follow up goals of care discussion  due: 12/02/20 Code Status: DNR Disposition: ICU    Critical Care Time devoted to patient care services described in this note is 45 minutes.   Overall, patient is critically ill, prognosis is guarded.  Patient with Multiorgan failure and at high risk for cardiac arrest and death.   Rosine Door, MD  12-02-20 12:30 PM [facility]

## 2020-11-06 NOTE — Progress Notes (Signed)
Pt placed back in supine position. Tol well.

## 2020-11-07 ENCOUNTER — Inpatient Hospital Stay: Payer: Medicare Other

## 2020-11-07 LAB — GLUCOSE, CAPILLARY
Glucose-Capillary: 146 mg/dL — ABNORMAL HIGH (ref 70–99)
Glucose-Capillary: 169 mg/dL — ABNORMAL HIGH (ref 70–99)
Glucose-Capillary: 170 mg/dL — ABNORMAL HIGH (ref 70–99)
Glucose-Capillary: 184 mg/dL — ABNORMAL HIGH (ref 70–99)
Glucose-Capillary: 192 mg/dL — ABNORMAL HIGH (ref 70–99)
Glucose-Capillary: 224 mg/dL — ABNORMAL HIGH (ref 70–99)

## 2020-11-07 LAB — CBC WITH DIFFERENTIAL/PLATELET
Abs Immature Granulocytes: 1.77 10*3/uL — ABNORMAL HIGH (ref 0.00–0.07)
Basophils Absolute: 0.2 10*3/uL — ABNORMAL HIGH (ref 0.0–0.1)
Basophils Relative: 1 %
Eosinophils Absolute: 0.2 10*3/uL (ref 0.0–0.5)
Eosinophils Relative: 1 %
HCT: 39.9 % (ref 39.0–52.0)
Hemoglobin: 11.6 g/dL — ABNORMAL LOW (ref 13.0–17.0)
Immature Granulocytes: 10 %
Lymphocytes Relative: 6 %
Lymphs Abs: 1.1 10*3/uL (ref 0.7–4.0)
MCH: 27 pg (ref 26.0–34.0)
MCHC: 29.1 g/dL — ABNORMAL LOW (ref 30.0–36.0)
MCV: 92.8 fL (ref 80.0–100.0)
Monocytes Absolute: 0.4 10*3/uL (ref 0.1–1.0)
Monocytes Relative: 3 %
Neutro Abs: 13.8 10*3/uL — ABNORMAL HIGH (ref 1.7–7.7)
Neutrophils Relative %: 79 %
Platelets: 261 10*3/uL (ref 150–400)
RBC: 4.3 MIL/uL (ref 4.22–5.81)
RDW: 15.3 % (ref 11.5–15.5)
Smear Review: NORMAL
WBC: 17.5 10*3/uL — ABNORMAL HIGH (ref 4.0–10.5)
nRBC: 0.2 % (ref 0.0–0.2)

## 2020-11-07 LAB — BLOOD GAS, ARTERIAL
Acid-Base Excess: 4.6 mmol/L — ABNORMAL HIGH (ref 0.0–2.0)
Bicarbonate: 35.2 mmol/L — ABNORMAL HIGH (ref 20.0–28.0)
FIO2: 1
MECHVT: 500 mL
Mechanical Rate: 24
O2 Saturation: 89 %
PEEP: 15 cmH2O
Patient temperature: 37
RATE: 24 resp/min
pCO2 arterial: 90 mmHg (ref 32.0–48.0)
pH, Arterial: 7.2 — ABNORMAL LOW (ref 7.350–7.450)
pO2, Arterial: 69 mmHg — ABNORMAL LOW (ref 83.0–108.0)

## 2020-11-07 LAB — BASIC METABOLIC PANEL
Anion gap: 10 (ref 5–15)
BUN: 104 mg/dL — ABNORMAL HIGH (ref 8–23)
CO2: 30 mmol/L (ref 22–32)
Calcium: 9 mg/dL (ref 8.9–10.3)
Chloride: 110 mmol/L (ref 98–111)
Creatinine, Ser: 2.31 mg/dL — ABNORMAL HIGH (ref 0.61–1.24)
GFR, Estimated: 29 mL/min — ABNORMAL LOW (ref >60)
Glucose, Bld: 204 mg/dL — ABNORMAL HIGH (ref 70–99)
Potassium: 5.2 mmol/L — ABNORMAL HIGH (ref 3.5–5.1)
Sodium: 150 mmol/L — ABNORMAL HIGH (ref 135–145)

## 2020-11-07 LAB — MAGNESIUM: Magnesium: 2.7 mg/dL — ABNORMAL HIGH (ref 1.7–2.4)

## 2020-11-07 LAB — PHOSPHORUS: Phosphorus: 4.9 mg/dL — ABNORMAL HIGH (ref 2.5–4.6)

## 2020-11-07 LAB — BRAIN NATRIURETIC PEPTIDE: B Natriuretic Peptide: 191.9 pg/mL — ABNORMAL HIGH (ref 0.0–100.0)

## 2020-11-07 LAB — C-REACTIVE PROTEIN: CRP: 24 mg/dL — ABNORMAL HIGH (ref <1.0)

## 2020-11-07 LAB — FERRITIN: Ferritin: 1121 ng/mL — ABNORMAL HIGH (ref 24–336)

## 2020-11-07 NOTE — Consult Note (Signed)
PHARMACY CONSULT NOTE  Pharmacy Consult for Electrolyte Monitoring and Replacement   Recent Labs: Potassium (mmol/L)  Date Value  11/07/2020 5.2 (H)   Magnesium (mg/dL)  Date Value  38/18/4037 2.7 (H)   Calcium (mg/dL)  Date Value  54/36/0677 9.0   Albumin (g/dL)  Date Value  03/40/3524 2.1 (L)   Phosphorus (mg/dL)  Date Value  81/85/9093 4.9 (H)   Sodium (mmol/L)  Date Value  11/07/2020 150 (H)   Corrected calcium: 9.9 mg/dL  Assessment: Patient is a 74 y/o M with medical history including morbid obesity, hypertension, chronic bilateral lower extremity venous stasis who was admitted 12/3 with COVID-19 pneumonia. Hospital course complicated by respiratory arrest on 12/6 requiring intubation. Patient transferred to the ICU and remains intubated, sedated, and on mechanical ventilation. Pharmacy consulted to assist with electrolyte monitoring and replacement as indicated.   Renal function improving, although with bump in creatinine 12/12 to 12/13. Patient is off vasopressors, BP stable. Remains on tube feeds.  Goal of Therapy:  Electrolytes within normal limits  Plan:  All electrolytes elevated. Continues on free water flushes at 250 ml q4h via tube. Will continue to follow closely.  Pricilla Riffle, PharmD 11/07/2020 11:49 AM

## 2020-11-07 NOTE — Progress Notes (Signed)
CRITICAL CARE NOTE 74 y.o.malewith medical history significant forMorbidobesity, hypertension,chronic bilateral lower extremity venous stasis on chronic Augmentin per PCP, presented to his PCP on 11/20/2020 for chief concerns of falling at home after tripping on the rugand feeling generalizedweakness.  He denies hitting his head and loss of consciousness.  At his PCP office, patient was found to be hypoxic with SPO2 of 88% on room air. He was placed on 2 L nasal cannula and per documentation his SPO2 increased to 94%. He was sent to the emergency department for further evaluation.  In the emergency department, patient tested positive for COVID-19 infection.Patient is not vaccinated for COVID-19 infection. Patient states that he has been tested for COVID-19 2 times in the past and always tested negative therefore he felt like he did not require Covid vaccination.   ED Course:Discussed patient with ED provider, patient is hypoxic secondary to COVID-19 infection.    12/6 admitted to ICU s/p cardiac arrest 12/6 severe resp failure ARDS from COVID now on vent, fio2 at 100% 12/7 CVL placed, ART line placed 12/7 PRONED FOR 16 hrs PF ratio 60 12/8 proned for 16 hrs 12/9 severe ARDS proned 12/13 severe hypoxia, severe ARDS    CC  follow up respiratory failure  SUBJECTIVE Patient remains critically ill Prognosis is guarded   Vent Mode: PRVC FiO2 (%):  [60 %-100 %] 100 % Set Rate:  [24 bmp] 24 bmp Vt Set:  [500 mL] 500 mL PEEP:  [10 cmH20-15 cmH20] 15 cmH20 CBC    Component Value Date/Time   WBC 17.5 (H) 11/07/2020 0609   RBC 4.30 11/07/2020 0609   HGB 11.6 (L) 11/07/2020 0609   HCT 39.9 11/07/2020 0609   PLT 261 11/07/2020 0609   MCV 92.8 11/07/2020 0609   MCH 27.0 11/07/2020 0609   MCHC 29.1 (L) 11/07/2020 0609   RDW 15.3 11/07/2020 0609   LYMPHSABS 1.1 11/07/2020 0609   MONOABS 0.4 11/07/2020 0609   EOSABS 0.2 11/07/2020 0609   BASOSABS 0.2 (H)  11/07/2020 0609   BMP Latest Ref Rng & Units 11/07/2020 11/06/2020 11/05/2020  Glucose 70 - 99 mg/dL 782(N) 562(Z) 308(M)  BUN 8 - 23 mg/dL 578(I) 69(G) 29(B)  Creatinine 0.61 - 1.24 mg/dL 2.84(X) 3.24(M) 0.10(U)  Sodium 135 - 145 mmol/L 150(H) 148(H) 148(H)  Potassium 3.5 - 5.1 mmol/L 5.2(H) 5.1 4.7  Chloride 98 - 111 mmol/L 110 111 109  CO2 22 - 32 mmol/L 30 31 29   Calcium 8.9 - 10.3 mg/dL 9.0 ) 7.2(Z)     BP (!) 164/63    Pulse (!) 111    Temp 99.5 F (37.5 C) (Axillary)    Resp (!) 24    Ht 5' 2.99" (1.6 m)    Wt 116.7 kg    SpO2 92%    BMI 45.59 kg/m    I/O last 3 completed shifts: In: 3571.8 [I.V.:1131.8; NG/GT:2440] Out: 2760 [Urine:2660; Stool:100] No intake/output data recorded.  SpO2: 92 % O2 Flow Rate (L/min): 55 L/min FiO2 (%): 100 %  Estimated body mass index is 45.59 kg/m as calculated from the following:   Height as of this encounter: 5' 2.99" (1.6 m).   Weight as of this encounter: 116.7 kg.  SIGNIFICANT EVENTS   REVIEW OF SYSTEMS  PATIENT IS UNABLE TO PROVIDE COMPLETE REVIEW OF SYSTEMS DUE TO SEVERE CRITICAL ILLNESS   Pressure Injury 11/05/20 Other (Comment) Mid;Upper Stage 2 -  Partial thickness loss of dermis presenting as a shallow open injury with a red, pink  wound bed without slough. (Active)  11/05/20 2000  Location: Other (Comment) (filtrum)  Location Orientation: Mid;Upper  Staging: Stage 2 -  Partial thickness loss of dermis presenting as a shallow open injury with a red, pink wound bed without slough.  Wound Description (Comments):   Present on Admission: No    COVID-19 DISASTER DECLARATION:   FULL CONTACT PHYSICAL EXAMINATION WAS NOT POSSIBLE DUE TO TREATMENT OF COVID-19  AND CONSERVATION OF PERSONAL PROTECTIVE EQUIPMENT, LIMITED EXAM FINDINGS INCLUDE-   PHYSICAL EXAMINATION:  GENERAL:critically ill appearing, +resp distress NEUROLOGIC: obtunded, GCS<8   Patient assessed or the symptoms described in the history of present  illness.  In the context of the Global COVID-19 pandemic, which necessitated consideration that the patient might be at risk for infection with the SARS-CoV-2 virus that causes COVID-19, Institutional protocols and algorithms that pertain to the evaluation of patients at risk for COVID-19 are in a state of rapid change based on information released by regulatory bodies including the CDC and federal and state organizations. These policies and algorithms were followed during the patient's care while in hospital.    MEDICATIONS: I have reviewed all medications and confirmed regimen as documented   CULTURE RESULTS   Recent Results (from the past 240 hour(s))  Resp Panel by RT-PCR (Flu A&B, Covid) Nasopharyngeal Swab     Status: Abnormal   Collection Time: 09-11-2020  4:14 PM   Specimen: Nasopharyngeal Swab; Nasopharyngeal(NP) swabs in vial transport medium  Result Value Ref Range Status   SARS Coronavirus 2 by RT PCR POSITIVE (A) NEGATIVE Final    Comment: RESULT CALLED TO, READ BACK BY AND VERIFIED WITH: LINDSEY BLACK AT 1720 11/12/2020.PMF (NOTE) SARS-CoV-2 target nucleic acids are DETECTED.  The SARS-CoV-2 RNA is generally detectable in upper respiratory specimens during the acute phase of infection. Positive results are indicative of the presence of the identified virus, but do not rule out bacterial infection or co-infection with other pathogens not detected by the test. Clinical correlation with patient history and other diagnostic information is necessary to determine patient infection status. The expected result is Negative.  Fact Sheet for Patients: BloggerCourse.comhttps://www.fda.gov/media/152166/download  Fact Sheet for Healthcare Providers: SeriousBroker.ithttps://www.fda.gov/media/152162/download  This test is not yet approved or cleared by the [place] FDA and  has been authorized for detection and/or diagnosis of SARS-CoV-2 by FDA under an Emergency Use Authorization (EUA).  This EUA will remain in  effect (meaning this test can be  used) for the duration of  the COVID-19 declaration under Section 564(b)(1) of the Act, 21 U.S.C. section 360bbb-3(b)(1), unless the authorization is terminated or revoked sooner.     Influenza A by PCR NEGATIVE NEGATIVE Final   Influenza B by PCR NEGATIVE NEGATIVE Final    Comment: (NOTE) The Xpert Xpress SARS-CoV-2/FLU/RSV plus assay is intended as an aid in the diagnosis of influenza from Nasopharyngeal swab specimens and should not be used as a sole basis for treatment. Nasal washings and aspirates are unacceptable for Xpert Xpress SARS-CoV-2/FLU/RSV testing.  Fact Sheet for Patients: BloggerCourse.comhttps://www.fda.gov/media/152166/download  Fact Sheet for Healthcare Providers: SeriousBroker.ithttps://www.fda.gov/media/152162/download  This test is not yet approved or cleared by the [place] FDA and has been authorized for detection and/or diagnosis of SARS-CoV-2 by FDA under an Emergency Use Authorization (EUA). This EUA will remain in effect (meaning this test can be used) for the duration of the COVID-19 declaration under Section 564(b)(1) of the Act, 21 U.S.C. section 360bbb-3(b)(1), unless the authorization is terminated or revoked.  Performed at [facility]  Lab, [address] [place], [place] [address]   Blood culture (routine x 2)     Status: None   Collection Time: 11/20/2020  8:42 PM   Specimen: BLOOD  Result Value Ref Range Status   Specimen Description BLOOD RIGHT FOREARM  Final   Special Requests   Final    BOTTLES DRAWN AEROBIC AND ANAEROBIC Blood Culture adequate volume   Culture   Final    NO GROWTH 5 DAYS Performed at [facility], [address] [place], [place] [address]    Report Status 11/02/2020 FINAL  Final  Blood Culture (routine x 2)     Status: None   Collection Time: 11/21/2020  8:43 PM   Specimen: BLOOD  Result Value Ref Range Status   Specimen Description BLOOD LEFT FOREARM  Final   Special Requests   Final     BOTTLES DRAWN AEROBIC AND ANAEROBIC Blood Culture results may not be optimal due to an excessive volume of blood received in culture bottles   Culture   Final    NO GROWTH 5 DAYS Performed at [facility], [address] [place], [place] [address]    Report Status 11/02/2020 FINAL  Final  MRSA PCR Screening     Status: None   Collection Time: 10/29/20  6:50 AM   Specimen: Nasal Mucosa; Nasopharyngeal  Result Value Ref Range Status   MRSA by PCR NEGATIVE NEGATIVE Final    Comment:        The GeneXpert MRSA Assay (FDA approved for NASAL specimens only), is one component of a comprehensive MRSA colonization surveillance program. It is not intended to diagnose MRSA infection nor to guide or monitor treatment for MRSA infections. Performed at [facility], [address] [place], [place] [address]   MRSA PCR Screening     Status: None   Collection Time: 10/31/20  5:16 PM   Specimen: Nasal Mucosa; Nasopharyngeal  Result Value Ref Range Status   MRSA by PCR NEGATIVE NEGATIVE Final    Comment:        The GeneXpert MRSA Assay (FDA approved for NASAL specimens only), is one component of a comprehensive MRSA colonization surveillance program. It is not intended to diagnose MRSA infection nor to guide or monitor treatment for MRSA infections. Performed at [facility], [address] [place], [place] [address]           Indwelling Urinary Catheter continued, requirement due to   Reason to continue Indwelling Urinary Catheter strict Intake/Output monitoring for hemodynamic instability   Central Line/ continued, requirement due to  Reason to continue [employer] Monitoring of central venous pressure or other hemodynamic parameters and poor IV access   Ventilator continued, requirement due to severe respiratory failure   Ventilator Sedation RASS 0 to -2      ASSESSMENT AND PLAN SYNOPSIS  Acute hypoxemic respiratory failure due to COVID-19  pneumonia / ARDS Mechanical ventilation via ARDS protocol, target PRVC 6 cc/kg Wean PEEP and FiO2 as able Goal plateau pressure less than 30, driving pressure less than 15 Paralytics if necessary for vent synchrony, gas exchange Cycle prone positioning if necessary for oxygenation Deep sedation per PAD protocol, goal RASS -4,  Diuresis as blood pressure and renal function can tolerate, goal CVP 5-8.   diuresis as tolerated based on Kidney function VAP prevention order set Remdesivir  IV STEROIDS   Severe ACUTE Hypoxic and Hypercapnic Respiratory Failure -continue Full MV support -continue Bronchodilator Therapy -Wean Fio2 and  PEEP as tolerated -VAP/VENT bundle implementation  ACUTE DIASTOLIC CARDIAC FAILURE-  -oxygen as needed -Lasix as tolerated   Morbid obesity, possible OSA.   Will certainly impact respiratory mechanics, ventilator weaning Suspect will need to consider additional PEEP   ACUTE KIDNEY INJURY/Renal Failure -continue Foley Catheter-assess need -Avoid nephrotoxic agents -Follow urine output, BMP -Ensure adequate renal perfusion, optimize oxygenation -Renal dose medications     NEUROLOGY Acute toxic metabolic encephalopathy, need for sedation Goal RASS -2 to -3  SHOCK-SEPSIS -use vasopressors to keep MAP>65  CARDIAC ICU monitoring  ID -continue IV abx as prescibed -follow up cultures  GI GI PROPHYLAXIS as indicated   DIET-->TF's as tolerated Constipation protocol as indicated  ENDO - will use ICU hypoglycemic\Hyperglycemia protocol if indicated     ELECTROLYTES -follow labs as needed -replace as needed -pharmacy consultation and following   DVT/GI PRX ordered and assessed TRANSFUSIONS AS NEEDED MONITOR FSBS I Assessed the need for Labs I Assessed the need for Foley I Assessed the need for Central Venous Line Family Discussion when available I Assessed the need for Mobilization I made an Assessment of medications to be  adjusted accordingly Safety Risk assessment completed   CASE DISCUSSED IN MULTIDISCIPLINARY ROUNDS WITH ICU TEAM  Critical Care Time devoted to patient care services described in this note is 47 minutes.   Overall, patient is critically ill, prognosis is guarded.  Patient with Multiorgan failure and at high risk for cardiac arrest and death.   Patient is DNR  Lucie Leather, M.D.  [facility]  Medical Director [facility] Medical Director [facility] Cardio-Pulmonary Department

## 2020-11-08 LAB — GLUCOSE, CAPILLARY
Glucose-Capillary: 184 mg/dL — ABNORMAL HIGH (ref 70–99)
Glucose-Capillary: 191 mg/dL — ABNORMAL HIGH (ref 70–99)
Glucose-Capillary: 197 mg/dL — ABNORMAL HIGH (ref 70–99)
Glucose-Capillary: 206 mg/dL — ABNORMAL HIGH (ref 70–99)

## 2020-11-08 LAB — MAGNESIUM: Magnesium: 2.7 mg/dL — ABNORMAL HIGH (ref 1.7–2.4)

## 2020-11-08 LAB — BASIC METABOLIC PANEL
Anion gap: 9 (ref 5–15)
BUN: 110 mg/dL — ABNORMAL HIGH (ref 8–23)
CO2: 32 mmol/L (ref 22–32)
Calcium: 9.1 mg/dL (ref 8.9–10.3)
Chloride: 112 mmol/L — ABNORMAL HIGH (ref 98–111)
Creatinine, Ser: 2.18 mg/dL — ABNORMAL HIGH (ref 0.61–1.24)
GFR, Estimated: 31 mL/min — ABNORMAL LOW (ref >60)
Glucose, Bld: 228 mg/dL — ABNORMAL HIGH (ref 70–99)
Potassium: 5.5 mmol/L — ABNORMAL HIGH (ref 3.5–5.1)
Sodium: 153 mmol/L — ABNORMAL HIGH (ref 135–145)

## 2020-11-08 LAB — CBC
HCT: 38.4 % — ABNORMAL LOW (ref 39.0–52.0)
Hemoglobin: 11.1 g/dL — ABNORMAL LOW (ref 13.0–17.0)
MCH: 26.9 pg (ref 26.0–34.0)
MCHC: 28.9 g/dL — ABNORMAL LOW (ref 30.0–36.0)
MCV: 93 fL (ref 80.0–100.0)
Platelets: 254 10*3/uL (ref 150–400)
RBC: 4.13 MIL/uL — ABNORMAL LOW (ref 4.22–5.81)
RDW: 15.1 % (ref 11.5–15.5)
WBC: 16.3 10*3/uL — ABNORMAL HIGH (ref 4.0–10.5)
nRBC: 0.2 % (ref 0.0–0.2)

## 2020-11-08 LAB — PHOSPHORUS: Phosphorus: 4 mg/dL (ref 2.5–4.6)

## 2020-11-08 MED ORDER — MIDAZOLAM HCL 2 MG/2ML IJ SOLN: 2.0000 mg | INTRAMUSCULAR | Status: DC | PRN

## 2020-11-08 MED ORDER — DIPHENHYDRAMINE HCL 50 MG/ML IJ SOLN: 25.0000 mg | INTRAMUSCULAR | Status: DC | PRN

## 2020-11-08 MED ORDER — GLYCOPYRROLATE 0.2 MG/ML IJ SOLN: 0.2000 mg | INTRAMUSCULAR | Status: DC | PRN

## 2020-11-08 MED ORDER — ACETAMINOPHEN 325 MG PO TABS: 650.0000 mg | ORAL_TABLET | Freq: Four times a day (QID) | ORAL | Status: DC | PRN

## 2020-11-08 MED ORDER — POLYVINYL ALCOHOL 1.4 % OP SOLN
1.0000 [drp] | Freq: Four times a day (QID) | OPHTHALMIC | Status: DC | PRN
  Filled 2020-11-08: qty 15

## 2020-11-08 MED ORDER — GLYCOPYRROLATE 1 MG PO TABS
1.0000 mg | ORAL_TABLET | ORAL | Status: DC | PRN
  Filled 2020-11-08: qty 1

## 2020-11-08 MED ORDER — MORPHINE 100MG IN NS 100ML (1MG/ML) PREMIX INFUSION
0.0000 mg/h | INTRAVENOUS | Status: DC
  Administered 2020-11-08: 13:00:00 5 mg/h via INTRAVENOUS
  Filled 2020-11-08: qty 100

## 2020-11-08 MED ORDER — MORPHINE BOLUS VIA INFUSION
5.0000 mg | INTRAVENOUS | Status: DC | PRN
Start: 2020-11-08 — End: 2020-11-08
  Administered 2020-11-08 (×2): 5 mg via INTRAVENOUS
  Filled 2020-11-08: qty 5

## 2020-11-08 MED ORDER — DEXTROSE 5 % IV SOLN: INTRAVENOUS | Status: DC

## 2020-11-08 MED ORDER — MORPHINE SULFATE (PF) 2 MG/ML IV SOLN: 2.0000 mg | INTRAVENOUS | Status: DC | PRN

## 2020-11-08 MED ORDER — ACETAMINOPHEN 650 MG RE SUPP: 650.0000 mg | Freq: Four times a day (QID) | RECTAL | Status: DC | PRN

## 2020-11-26 NOTE — Progress Notes (Signed)
Comfort care initiated.

## 2020-11-26 NOTE — Progress Notes (Signed)
Nutrition Brief Follow-Up Note  Chart reviewed. Patient now transitioning to comfort care.   No further nutrition interventions warranted at this time. Please re-consult RD as needed.   Bengie Kaucher King, MS, RD, LDN Pager number available on Amion   

## 2020-11-26 NOTE — Death Summary Note (Signed)
DEATH SUMMARY   Patient Details  Name: Jon Thomas MRN: [id number] DOB: 1946-01-27  Admission/Discharge Information   Admit Date:  11/07/2020  Date of Death:   11/04/2020   Time of Death:  130PM  Length of Stay: 11  Referring Physician: Barbette ReichmannHande, Vishwanath, MD   Reason(s) for Hospitalization  COVID 19 PNEUMONIA  Diagnoses  Preliminary cause of death: COVID 19 pneumonia Secondary Diagnoses (including complications and co-morbidities):  Principal Problem:   COVID-19 Active Problems:   HTN (hypertension)   HLD (hyperlipidemia)   Obesity, Class III, BMI 40-49.9 (morbid obesity) (HCC)   Obesity, diabetes, and hypertension syndrome (HCC)   Severe sepsis (HCC)   Abdominal obesity and metabolic syndrome   Chronic venous stasis dermatitis of both lower extremities   Acute respiratory failure with hypoxia (HCC)   Acute kidney injury (HCC)   Pressure injury of skin   Brief Hospital Course (including significant findings, care, treatment, and services provided and events leading to death)   75 y.o.malewith medical history significant forMorbidobesity, hypertension,chronic bilateral lower extremity venous stasis on chronic Augmentin per PCP, presented to his PCP on 11/15/2020 for chief concerns of falling at home after tripping on the rugand feeling generalizedweakness.  He denies hitting his head and loss of consciousness.  At his PCP office, patient was found to be hypoxic with SPO2 of 88% on room air. He was placed on 2 L nasal cannula and per documentation his SPO2 increased to 94%. He was sent to the emergency department for further evaluation.  In the emergency department, patient tested positive for COVID-19 infection.Patient is not vaccinated for COVID-19 infection. Patient states that he has been tested for COVID-19 2 times in the past and always tested negative therefore he felt like he did not require Covid vaccination.   ED Course:Discussed patient  with ED provider, patient is hypoxic secondary to COVID-19 infection.    12/6 admitted to ICU s/p cardiac arrest 12/6 severe resp failure ARDS from COVID now on vent, fio2 at 100% 12/7 CVL placed, ART line placed 12/7 PRONED FOR 16 hrsPF ratio 60 12/8 proned for 16 hrs 12/9 severe ARDSproned 12/13 severe hypoxia, severe ARDS 12/14 progressive multiorgan failure   GOALS OF CARE DISCUSSION  The Clinical status was relayed to family in detail.  Updated and notified of patients medical condition.  Patient remains unresponsive and will not open eyes to command.     patient with increased WOB and using accessory muscles to breathe Explained to family course of therapy and the modalities     Patient with Progressive multiorgan failure with very low chance of meaningful recovery despite all aggressive and optimal medical therapy. Patient is in the Dying  Process associated with Suffering.  Family understands the situation.  They have consented and agreed to DNR/DNI and would like to proceed with Comfort care measures.  Family are satisfied with Plan of action and management. All questions answered  Comfort care orders placed   TOD 130PM   Pertinent Labs and Studies  Significant Diagnostic Studies DG Chest 2 View  Result Date: 11/11/2020 CLINICAL DATA:  Shortness of breath. EXAM: CHEST - 2 VIEW COMPARISON:  None. FINDINGS: The heart, hila, and mediastinum are normal. No pneumothorax. Bilateral patchy pulmonary opacities are identified. IMPRESSION: Bilateral patchy pulmonary opacities are favored to represent a multifocal infectious process. Edema considered less likely. Electronically Signed   By: Gerome Samavid  Williams III M.D   On: 03-09-20 15:32   CT Angio Chest PE W and/or Wo Contrast  Result Date: 10/30/2020 CLINICAL DATA:  Shortness of breath elevated troponin which which EXAM: CT ANGIOGRAPHY CHEST WITH CONTRAST TECHNIQUE: Multidetector CT imaging of the chest was  performed using the standard protocol during bolus administration of intravenous contrast. Multiplanar CT image reconstructions and MIPs were obtained to evaluate the vascular anatomy. CONTRAST:  62mL OMNIPAQUE IOHEXOL 350 MG/ML SOLN COMPARISON:  None. FINDINGS: Cardiovascular: There is a optimal opacification of the pulmonary arteries. There is no central,segmental, or subsegmental filling defects within the pulmonary arteries. The heart is normal in size. No pericardial effusion or thickening. No evidence right heart strain. There is normal three-vessel brachiocephalic anatomy without proximal stenosis. Scattered atherosclerosis is noted. Mediastinum/Nodes: No hilar, mediastinal, or axillary adenopathy. Thyroid gland, trachea, and esophagus demonstrate no significant findings. Lungs/Pleura: Multifocal patchy airspace opacities are seen throughout both lungs, predominantly within both lung bases. No pleural effusion is seen. Upper Abdomen: No acute abnormalities present in the visualized portions of the upper abdomen. Musculoskeletal: No chest wall abnormality. No acute or significant osseous findings. Review of the MIP images confirms the above findings. IMPRESSION: No central, segmental, or subsegmental pulmonary embolism. Multifocal patchy airspace opacities throughout both lungs, likely consistent with multifocal pneumonia. Aortic Atherosclerosis (ICD10-I70.0). Electronically Signed   By: Jonna Clark M.D.   On: 10/26/2020 19:22   DG Chest Port 1 View  Result Date: 11/07/2020 CLINICAL DATA:  Intubated. EXAM: PORTABLE CHEST 1 VIEW COMPARISON:  11/05/2020 FINDINGS: Endotracheal tube terminates approximately 3 cm above the carina. Enteric tube courses into the abdomen with tip not imaged. The cardiomediastinal silhouette is unchanged. Widespread patchy airspace opacities throughout both lungs are unchanged. No sizable pleural effusion or pneumothorax is identified. IMPRESSION: Unchanged bilateral airspace  disease consistent with pneumonia. Electronically Signed   By: Sebastian Ache M.D.   On: 11/07/2020 09:19   DG Chest Port 1 View  Result Date: 11/05/2020 CLINICAL DATA:  Endotracheal tube placement. EXAM: PORTABLE CHEST 1 VIEW COMPARISON:  October 31, 2020. FINDINGS: Stable cardiomediastinal silhouette. Endotracheal and nasogastric tubes are in good position. No pneumothorax or pleural effusion is noted. Stable patchy bilateral airspace opacities are noted consistent with multifocal pneumonia. Bony thorax is unremarkable. IMPRESSION: Endotracheal and nasogastric tubes in good position. Stable patchy bilateral airspace opacities consistent with multifocal pneumonia. Electronically Signed   By: Lupita Raider M.D.   On: 11/05/2020 10:12   DG Chest Port 1 View  Result Date: 10/31/2020 CLINICAL DATA:  Unsuccessful attempt at left IJ EXAM: PORTABLE CHEST 1 VIEW COMPARISON:  10/31/2020, 11/12/2020 FINDINGS: Endotracheal tube tip about a cm superior to the carina. Esophageal tube tip below the diaphragm but incompletely visualized. Additional tubing with punctate radiopaque marker overlies the distal mediastinum. No central venous catheter is evident. There is no pneumothorax. Extensive bilateral airspace infiltrates without significant change. IMPRESSION: 1. No significant interval change in extensive bilateral airspace disease. 2. Negative for left pneumothorax. Endotracheal tube tip about a cm superior to the carina. Electronically Signed   By: Jasmine Pang M.D.   On: 10/31/2020 22:40   DG Chest Port 1 View  Addendum Date: 10/31/2020   ADDENDUM REPORT: 10/31/2020 16:24 ADDENDUM: ETT position discussed by telephone with Dr. Erin Fulling on 10/31/2020 at 1609 hours. Electronically Signed   By: Odessa Fleming M.D.   On: 10/31/2020 16:24   Result Date: 10/31/2020 CLINICAL DATA:  75 year old male positive COVID-19.  Intubated. EXAM: PORTABLE CHEST 1 VIEW COMPARISON:  Portable chest 10/30/2020, CTA chest 11/22/2020 and  earlier. FINDINGS: Portable AP semi upright view  at 1523 hours. Endotracheal tube tip now in place and at or very near the carina (arrow). Enteric tube placed into the stomach, tip not included. Stable lung volumes and mediastinal contours. Patchy asymmetric bilateral pulmonary opacity greatest in the right upper lung and left lung base has not significantly changed. No pneumothorax. No pleural effusion is evident. No areas of worsening ventilation. Negative visible bowel gas pattern. IMPRESSION: 1. Endotracheal tube tip at or very near the carina. Recommend retraction of 1 cm for more optimal placement. 2. Enteric tube placed into the stomach, tip not included. 3. Stable bilateral COVID-19 pneumonia. Electronically Signed: By: Odessa Fleming M.D. On: 10/31/2020 15:45   DG Chest Port 1 View  Result Date: 10/30/2020 CLINICAL DATA:  Tachycardia.  COVID positive. EXAM: PORTABLE CHEST 1 VIEW COMPARISON:  11-22-2020 FINDINGS: There are diffuse bilateral hazy airspace opacities which have significantly worsened since the prior study. There is no pneumothorax or large pleural effusion. The heart size is unchanged. Aortic calcifications are noted. IMPRESSION: Worsening multifocal airspace opacities consistent with the patient's history of viral pneumonia. Electronically Signed   By: Katherine Mantle M.D.   On: 10/30/2020 00:44   DG Abd Portable 1V  Result Date: 10/31/2020 CLINICAL DATA:  75 year old male positive COVID-19.  Intubated. EXAM: PORTABLE ABDOMEN - 1 VIEW COMPARISON:  Portable chest from the same time today. FINDINGS: Portable AP semi upright view at 1523 hours. Enteric tube courses into the stomach with tip at the gastric antrum. Side hole is at the junction of the distal body and antrum. Visible bowel gas pattern is within normal limits. No acute osseous abnormality identified. IMPRESSION: Enteric tube placed with tip at the distal stomach. Electronically Signed   By: Odessa Fleming M.D.   On: 10/31/2020  15:46   ECHOCARDIOGRAM COMPLETE  Result Date: 11/02/2020    ECHOCARDIOGRAM REPORT   Patient Name:   Jon Thomas Date of Exam: 11/02/2020 Medical Rec #:  [id number]          Height:       63.0 in Accession #:    [id number]         Weight:       250.1 lb Date of Birth:  05-15-1946          BSA:          2.126 m Patient Age:    74 years           BP:           110/44 mmHg Patient Gender: M                  HR:           70 bpm. Exam Location:  [facility] Procedure: 2D Echo, Cardiac Doppler and Color Doppler Indications:     Cardiac arrest 146.9  History:         Patient has no prior history of Echocardiogram examinations.                  Risk Factors:Dyslipidemia and Hypertension.  Sonographer:     Cristela Blue RDCS (AE) Referring Phys:  [id number] Judithe Modest Diagnosing Phys: Harold Hedge MD  Sonographer Comments: Technically challenging study due to limited acoustic windows, no apical window, no subcostal window and echo performed with patient supine and on artificial respirator. IMPRESSIONS  1. Left ventricular ejection fraction, by estimation, is 55 to 60%. The left ventricle has normal function. The left ventricle has no regional wall  motion abnormalities. There is mild left ventricular hypertrophy. Left ventricular diastolic parameters are consistent with Grade I diastolic dysfunction (impaired relaxation).  2. Right ventricular systolic function is normal. The right ventricular size is normal.  3. The mitral valve is grossly normal. Trivial mitral valve regurgitation.  4. The aortic valve is grossly normal. Aortic valve regurgitation is not visualized. FINDINGS  Left Ventricle: Left ventricular ejection fraction, by estimation, is 55 to 60%. The left ventricle has normal function. The left ventricle has no regional wall motion abnormalities. The left ventricular internal cavity size was normal in size. There is  mild left ventricular hypertrophy. Left ventricular diastolic parameters are consistent with  Grade I diastolic dysfunction (impaired relaxation). Right Ventricle: The right ventricular size is normal. No increase in right ventricular wall thickness. Right ventricular systolic function is normal. Left Atrium: Left atrial size was normal in size. Right Atrium: Right atrial size was normal in size. Pericardium: There is no evidence of pericardial effusion. Mitral Valve: The mitral valve is grossly normal. Trivial mitral valve regurgitation. Tricuspid Valve: The tricuspid valve is grossly normal. Tricuspid valve regurgitation is trivial. Aortic Valve: The aortic valve is grossly normal. Aortic valve regurgitation is not visualized. Pulmonic Valve: The pulmonic valve was not well visualized. Pulmonic valve regurgitation is trivial. Aorta: The aortic root is normal in size and structure. IAS/Shunts: The atrial septum is grossly normal.  LEFT VENTRICLE PLAX 2D LVIDd:         3.81 cm LVIDs:         2.76 cm LV PW:         1.57 cm LV IVS:        1.44 cm LVOT diam:     2.00 cm LVOT Area:     3.14 cm  LEFT ATRIUM         Index LA diam:    3.20 cm 1.50 cm/m                        PULMONIC VALVE AORTA                 PV Vmax:        0.73 m/s Ao Root diam: 3.40 cm PV Peak grad:   2.1 mmHg                       RVOT Peak grad: 4 mmHg  TRICUSPID VALVE TR Peak grad:   7.5 mmHg TR Vmax:        137.00 cm/s  SHUNTS Systemic Diam: 2.00 cm Harold Hedge MD Electronically signed by Harold Hedge MD Signature Date/Time: 11/02/2020/12:29:58 PM    Final    US Abdomen Limited RUQ (LIVER/GB)  Result Date: 10/30/2020 CLINICAL DATA:  Transaminitis EXAM: ULTRASOUND ABDOMEN LIMITED RIGHT UPPER QUADRANT COMPARISON:  None. FINDINGS: Gallbladder: There is sludge in the gallbladder with no wall thickening, pericholecystic fluid, stones, or Murphy's sign. Common bile duct: Diameter: 3.5 mm Liver: Increased echogenicity with no focal mass. Portal vein is patent on color Doppler imaging with normal direction of blood flow towards the liver.  Other: None. IMPRESSION: 1. Mild sludge in the gallbladder. The gallbladder is otherwise normal in appearance. 2. Probable hepatic steatosis with increased echogenicity in the liver. Electronically Signed   By: Gerome Sam III M.D   On: 10/30/2020 10:16    Microbiology Recent Results (from the past 240 hour(s))  MRSA PCR Screening     Status: None  Collection Time: 10/31/20  5:16 PM   Specimen: Nasal Mucosa; Nasopharyngeal  Result Value Ref Range Status   MRSA by PCR NEGATIVE NEGATIVE Final    Comment:        The GeneXpert MRSA Assay (FDA approved for NASAL specimens only), is one component of a comprehensive MRSA colonization surveillance program. It is not intended to diagnose MRSA infection nor to guide or monitor treatment for MRSA infections. Performed at [facility], [address] [place], [place] [address]     Lab Basic Metabolic Panel: Recent Labs  Lab 11/03/20 0345 11/04/20 0410 11/05/20 0518 11/06/20 0300 11/07/20 0609 11/12/2020 0535  NA 146* 146* 148* 148* 150* 153*  K 3.8 4.4 4.7 5.1 5.2* 5.5*  CL 112* 110 109 111 110 112*  CO2 26 26 29 31 30  32  GLUCOSE 255* 287* 248* 195* 204* 228*  BUN 87* 97* 98* 98* 104* 110*  CREATININE 2.61* 2.37* 2.37* 2.18* 2.31* 2.18*  CALCIUM 7.6* 7.8* 8.4* 8.4* 9.0 9.1  MG 2.7*  --  2.7* 2.7* 2.7* 2.7*  PHOS 2.2* 3.3 4.9* 4.1 4.9* 4.0   Liver Function Tests: Recent Labs  Lab 11/02/20 0329 11/03/20 0345  AST 109* 57*  ALT 151* 100*  ALKPHOS 47 42  BILITOT 0.6 0.5  PROT 6.2* 5.8*  ALBUMIN 2.4* 2.1*   No results for input(s): LIPASE, AMYLASE in the last 168 hours. No results for input(s): AMMONIA in the last 168 hours. CBC: Recent Labs  Lab 11/02/20 0329 11/03/20 0345 11/05/20 0518 11/06/20 0300 11/07/20 0609 11/06/2020 0535  WBC 20.4* 18.3* 16.9* 16.0* 17.5* 16.3*  NEUTROABS 17.5* 15.3*  --  12.5* 13.8*  --   HGB 13.1 11.7* 12.0* 11.2* 11.6* 11.1*  HCT 40.6 37.8* 39.9 37.8* 39.9 38.4*  MCV  84.9 86.9 90.3 90.4 92.8 93.0  PLT 228 233 271 270 261 254   Cardiac Enzymes: No results for input(s): CKTOTAL, CKMB, CKMBINDEX, TROPONINI in the last 168 hours. Sepsis Labs: Recent Labs  Lab 11/05/20 0518 11/06/20 0300 11/07/20 0609 11/07/2020 0535  WBC 16.9* 16.0* 17.5* 16.3*     Laurie Penado 11/23/2020, 2:18 PM

## 2020-11-26 NOTE — Care Plan (Signed)
Patient is extubated to room air 

## 2020-11-26 NOTE — Progress Notes (Signed)
GOALS OF CARE DISCUSSION  The Clinical status was relayed to family in detail-Wife and Brother at bedside  Updated and notified of patients medical condition.  Patient remains unresponsive and will not open eyes to command.    patient with increased WOB and using accessory muscles to breathe Explained to family course of therapy and the modalities     Patient with Progressive multiorgan failure with very low chance of meaningful recovery despite all aggressive and optimal medical therapy. Patient is in the Dying  Process associated with Suffering.  Family understands the situation.  They have consented and agreed to DNR/DNI and would like to proceed with Comfort care measures.  Family are satisfied with Plan of action and management. All questions answered  Additional CC time 32 mins   Annette Bertelson Santiago Glad, M.D.  [facility]  Medical Director [facility] Medical Director [facility] Cardio-Pulmonary Department

## 2020-11-26 NOTE — Progress Notes (Signed)
Spoke with Apolinar Junes from pharmacy regarding Morphine gtt waste. Pt had morphine gtt 100mg /110ml infusing from 1247-1321. Pt also received two 5mg  boluses within that time frame. Pt expired at 1330, and morphine gtt was stopped. Remaining morphine gtt was wasted with charge nurse at the time of expiration, however wasting in the Pyxis was not done prior to the pt being discharged from the system. 9ml was wasted with , RN in the stericycle.

## 2020-11-26 NOTE — Progress Notes (Signed)
See Brooks Sailors RN's note. This RN witnessed waste of 60 mL of morphine in the stericycle.

## 2020-11-26 NOTE — Progress Notes (Signed)
CRITICAL CARE NOTE 75 y.o.malewith medical history significant forMorbidobesity, hypertension,chronic bilateral lower extremity venous stasis on chronic Augmentin per PCP, presented to his PCP on 11-11-20 for chief concerns of falling at home after tripping on the rugand feeling generalizedweakness.  He denies hitting his head and loss of consciousness.  At his PCP office, patient was found to be hypoxic with SPO2 of 88% on room air. He was placed on 2 L nasal cannula and per documentation his SPO2 increased to 94%. He was sent to the emergency department for further evaluation.  In the emergency department, patient tested positive for COVID-19 infection.Patient is not vaccinated for COVID-19 infection. Patient states that he has been tested for COVID-19 2 times in the past and always tested negative therefore he felt like he did not require Covid vaccination.   ED Course:Discussed patient with ED provider, patient is hypoxic secondary to COVID-19 infection.    12/6 admitted to ICU s/p cardiac arrest 12/6 severe resp failure ARDS from COVID now on vent, fio2 at 100% 12/7 CVL placed, ART line placed 12/7 PRONED FOR 16 hrsPF ratio 60 12/8 proned for 16 hrs 12/9 severe ARDS proned 12/13 severe hypoxia, severe ARDS 12/14 progressive multiorgan failure     CC  follow up respiratory failure  SUBJECTIVE Patient remains critically ill Prognosis is guarded   BP (!) 147/56   Pulse 97   Temp (!) 97.2 F (36.2 C) (Axillary)   Resp (!) 30   Ht 5' 2.99" (1.6 m)   Wt 116.7 kg   SpO2 (!) 87%   BMI 45.59 kg/m    I/O last 3 completed shifts: In: 2075.6 [I.V.:1165.6; NG/GT:910] Out: 2810 [Urine:2710; Stool:100] No intake/output data recorded.  SpO2: (!) 87 % O2 Flow Rate (L/min): 55 L/min FiO2 (%): 100 %  Estimated body mass index is 45.59 kg/m as calculated from the following:   Height as of this encounter: 5' 2.99" (1.6 m).   Weight as of this  encounter: 116.7 kg.  SIGNIFICANT EVENTS   REVIEW OF SYSTEMS  PATIENT IS UNABLE TO PROVIDE COMPLETE REVIEW OF SYSTEMS DUE TO SEVERE CRITICAL ILLNESS   Pressure Injury 11/05/20 Other (Comment) Mid;Upper Stage 2 -  Partial thickness loss of dermis presenting as a shallow open injury with a red, pink wound bed without slough. (Active)  11/05/20 2000  Location: Other (Comment) (filtrum)  Location Orientation: Mid;Upper  Staging: Stage 2 -  Partial thickness loss of dermis presenting as a shallow open injury with a red, pink wound bed without slough.  Wound Description (Comments):   Present on Admission: No      PHYSICAL EXAMINATION:  GENERAL:critically ill appearing, +resp distress NECK: Supple.  PULMONARY: +rhonchi, +wheezing CARDIOVASCULAR: S1 and S2. Regular rate and rhythm. No murmurs, rubs, or gallops.  GASTROINTESTINAL: Soft, nontender, -distended.  Positive bowel sounds.   MUSCULOSKELETAL: No swelling, clubbing, or edema.  NEUROLOGIC: obtunded, GCS<8 SKIN:intact,warm,dry  MEDICATIONS: I have reviewed all medications and confirmed regimen as documented   CULTURE RESULTS   Recent Results (from the past 240 hour(s))  MRSA PCR Screening     Status: None   Collection Time: 10/31/20  5:16 PM   Specimen: Nasal Mucosa; Nasopharyngeal  Result Value Ref Range Status   MRSA by PCR NEGATIVE NEGATIVE Final    Comment:        The GeneXpert MRSA Assay (FDA approved for NASAL specimens only), is one component of a comprehensive MRSA colonization surveillance program. It is not intended to diagnose MRSA infection nor  to guide or monitor treatment for MRSA infections. Performed at [facility], [address], [place], [place] [address]           IMAGING    DG Chest Port 1 View  Result Date: 11/07/2020 CLINICAL DATA:  Intubated. EXAM: PORTABLE CHEST 1 VIEW COMPARISON:  11/05/2020 FINDINGS: Endotracheal tube terminates approximately 3 cm above the carina.  Enteric tube courses into the abdomen with tip not imaged. The cardiomediastinal silhouette is unchanged. Widespread patchy airspace opacities throughout both lungs are unchanged. No sizable pleural effusion or pneumothorax is identified. IMPRESSION: Unchanged bilateral airspace disease consistent with pneumonia. Electronically Signed   By: Sebastian Ache M.D.   On: 11/07/2020 09:19     Nutrition Status: Nutrition Problem: Inadequate oral intake Etiology: inability to eat Signs/Symptoms: NPO status Interventions: Tube feeding,Prostat     Indwelling Urinary Catheter continued, requirement due to   Reason to continue Indwelling Urinary Catheter strict Intake/Output monitoring for hemodynamic instability   Central Line/ continued, requirement due to  Reason to continue [employer] Monitoring of central venous pressure or other hemodynamic parameters and poor IV access   Ventilator continued, requirement due to severe respiratory failure   Ventilator Sedation RASS 0 to -2      ASSESSMENT AND PLAN SYNOPSIS   Severe ACUTE Hypoxic and Hypercapnic Respiratory Failure due to severe COVID 19 pneumonia and ARDS with progressive Renal failure and multiorgan failure -continue Full MV support -continue Bronchodilator Therapy -Wean Fio2 and PEEP as tolerated -VAP/VENT bundle implementation  ACUTE DIASTOLICIC CARDIAC FAILURE- -oxygen as needed -Lasix as tolerated    Morbid obesity, possible OSA.   Will certainly impact respiratory mechanics, ventilator weaning Suspect will need to consider additional PEEP  ACUTE KIDNEY INJURY/Renal Failure -continue Foley Catheter-assess need -Avoid nephrotoxic agents -Follow urine output, BMP -Ensure adequate renal perfusion, optimize oxygenation -Renal dose medications     NEUROLOGY Acute toxic metabolic encephalopathy, need for sedation Goal RASS -2 to -3  SHOCK-SEPSIS -use vasopressors to keep MAP>65   CARDIAC ICU  monitoring  ID -continue IV abx as prescibed -follow up cultures  GI GI PROPHYLAXIS as indicated  NUTRITIONAL STATUS Nutrition Status: Nutrition Problem: Inadequate oral intake Etiology: inability to eat Signs/Symptoms: NPO status Interventions: Tube feeding,Prostat   DIET-->TF's as tolerated Constipation protocol as indicated  ENDO - will use ICU hypoglycemic\Hyperglycemia protocol if indicated     ELECTROLYTES -follow labs as needed -replace as needed -pharmacy consultation and following   DVT/GI PRX ordered and assessed TRANSFUSIONS AS NEEDED MONITOR FSBS I Assessed the need for Labs I Assessed the need for Foley I Assessed the need for Central Venous Line Family Discussion when available I Assessed the need for Mobilization I made an Assessment of medications to be adjusted accordingly Safety Risk assessment completed   CASE DISCUSSED IN MULTIDISCIPLINARY ROUNDS WITH ICU TEAM  Critical Care Time devoted to patient care services described in this note is 55 minutes.   Overall, patient is critically ill, prognosis is guarded.  Patient with Multiorgan failure and at high risk for cardiac arrest and death.    Patient is DNR, after discussion with family, patient will be made comfort care measures today  Lucie Leather, M.D.  [facility]  Medical Director [facility] Medical Director [facility] Cardio-Pulmonary Department

## 2020-11-26 DEATH — deceased

## 2021-11-28 IMAGING — US US ABDOMEN LIMITED
1 series · 14 of 25 positions shown · non-contrast
Comparison: None.

CLINICAL DATA: Transaminitis

EXAM:
ULTRASOUND ABDOMEN LIMITED RIGHT UPPER QUADRANT

[Series 1: us abdomen limited ruq (liver/gb) · 14 of 34 slices shown]
[im 1/34]
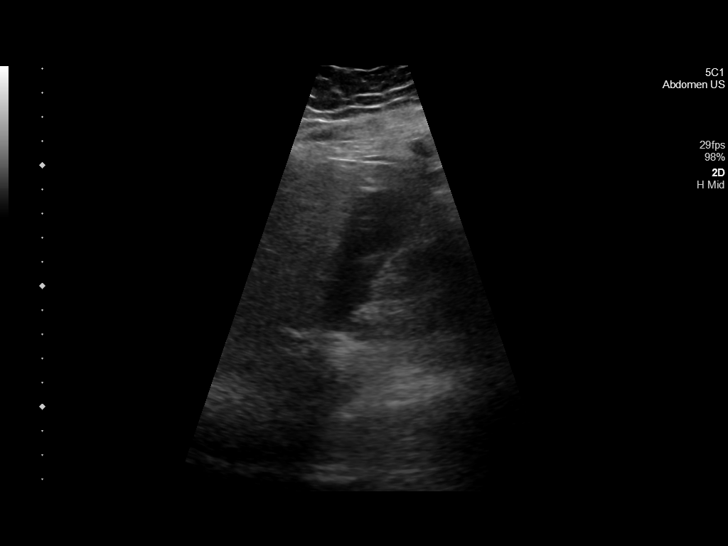
[im 3/34]
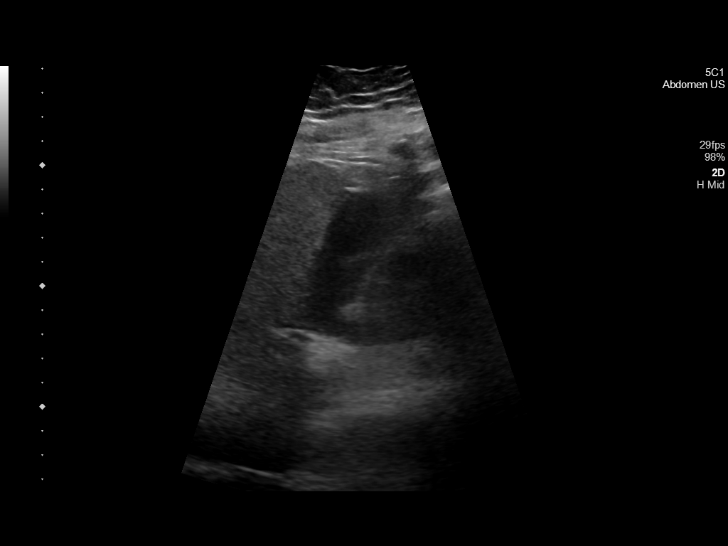
[im 6/34]
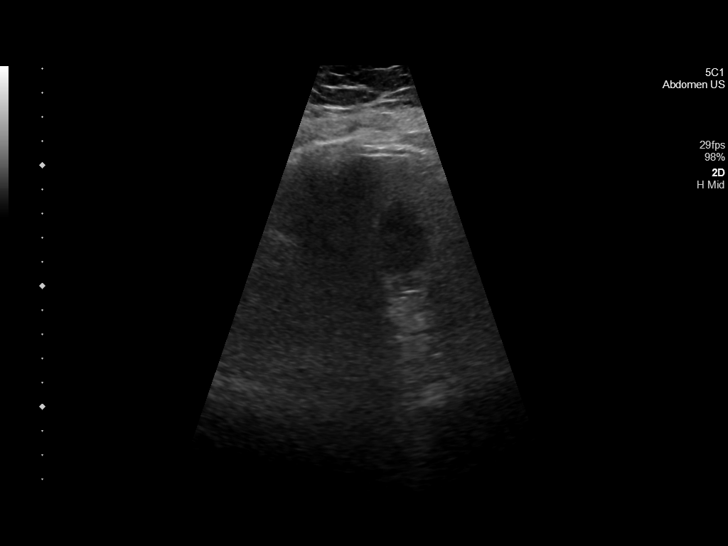
[im 9/34]
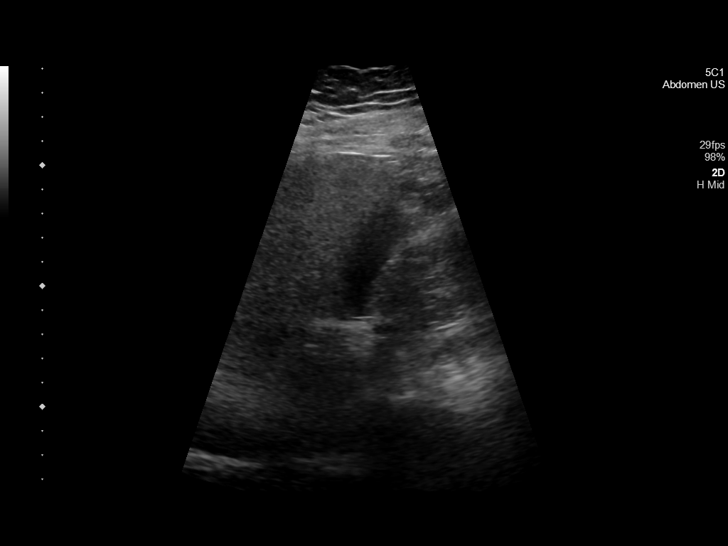
[im 12/34]
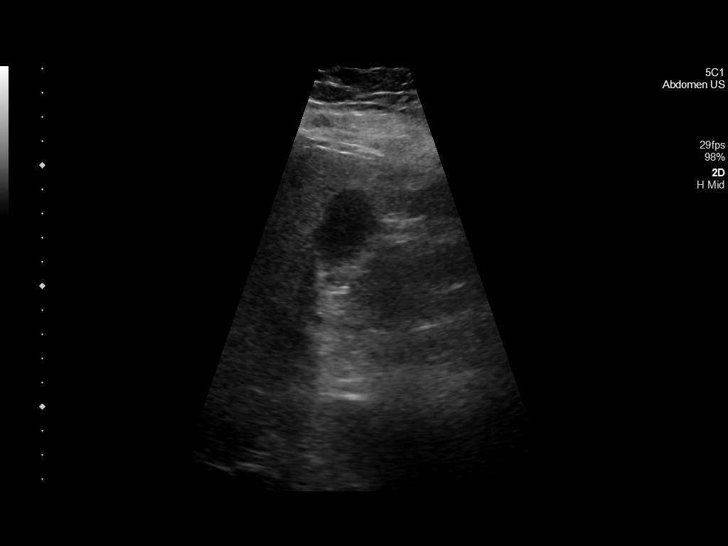
[im 13/34]
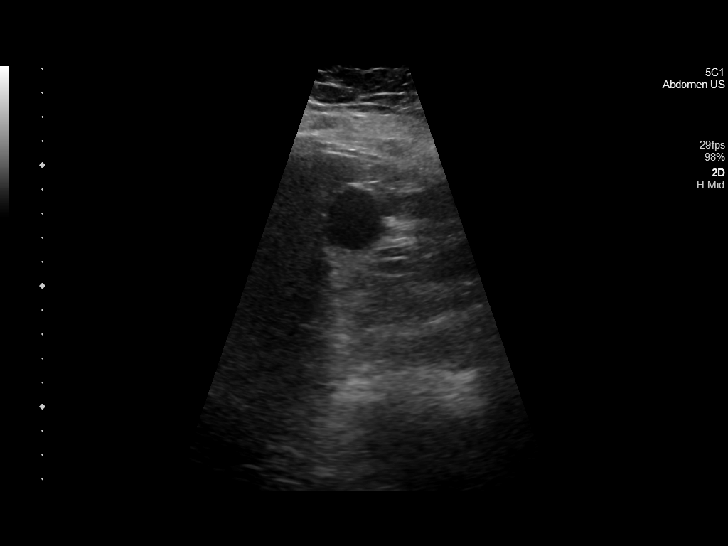
[im 16/34]
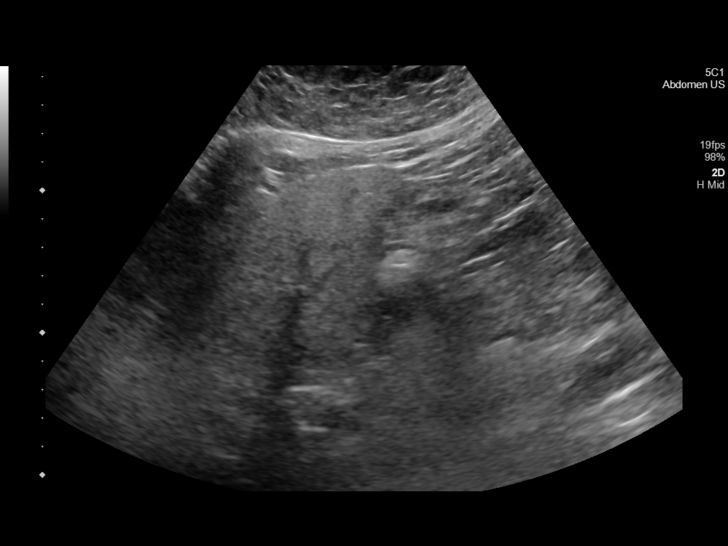
[im 18/34]
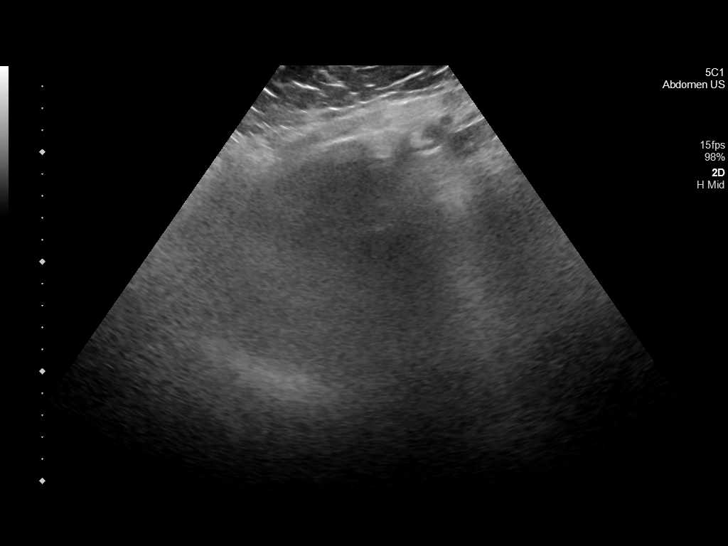
[im 21/34]
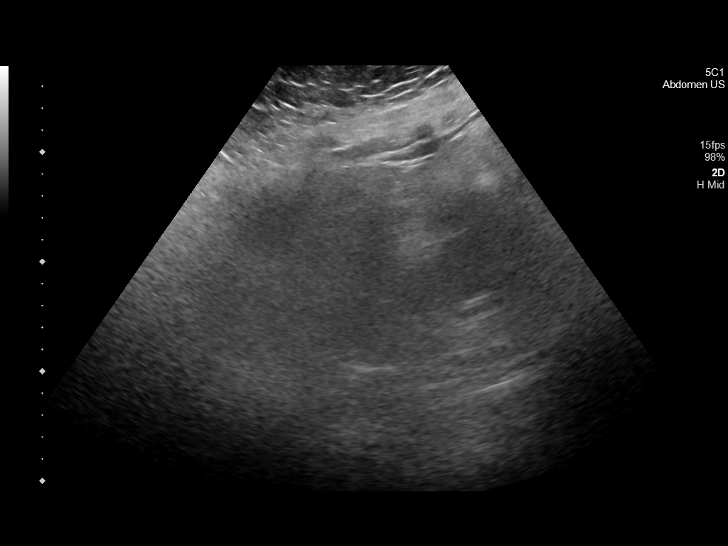
[im 23/34]
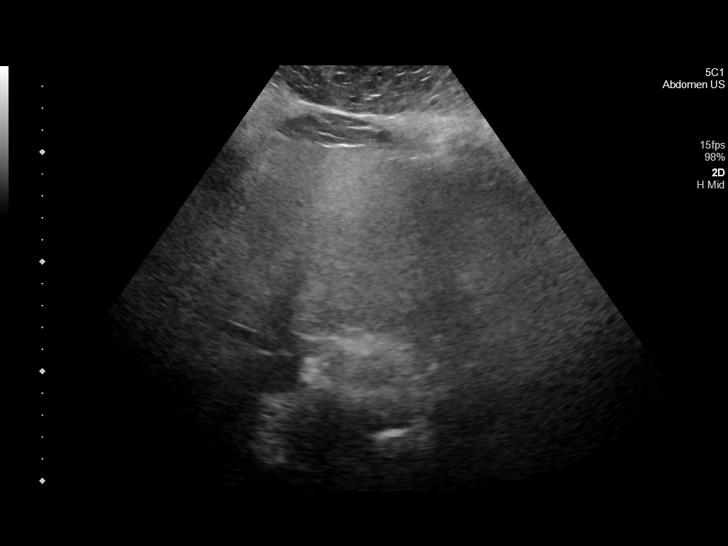
[im 25/34]
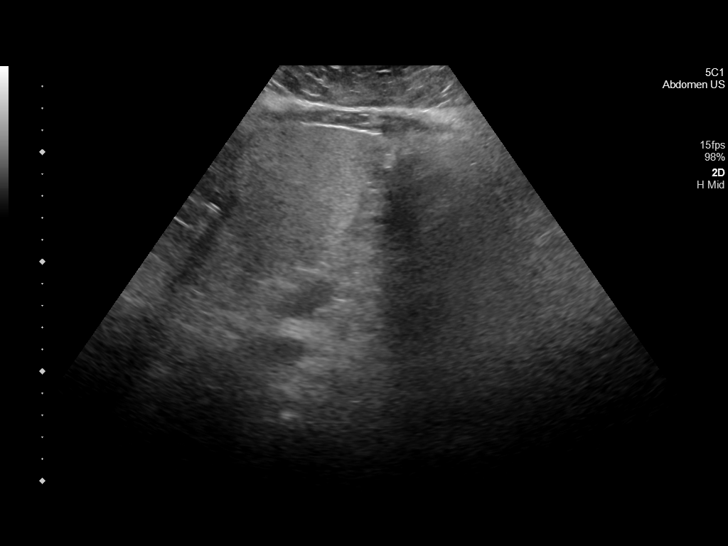
[im 28/34]
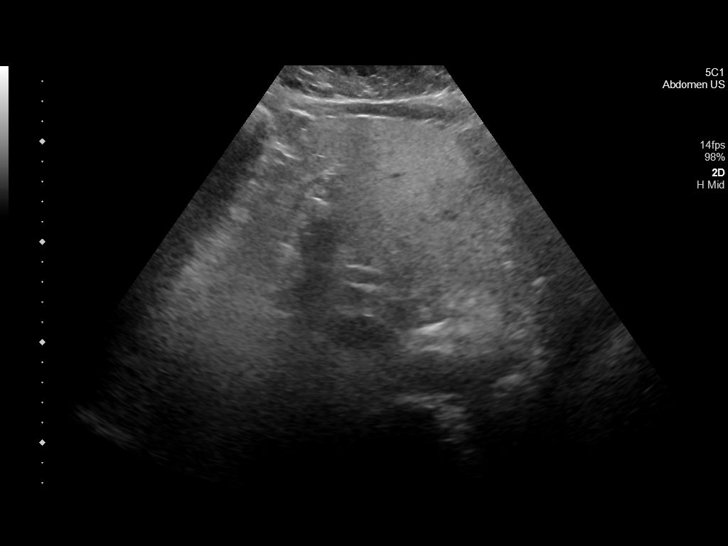
[im 31/34]
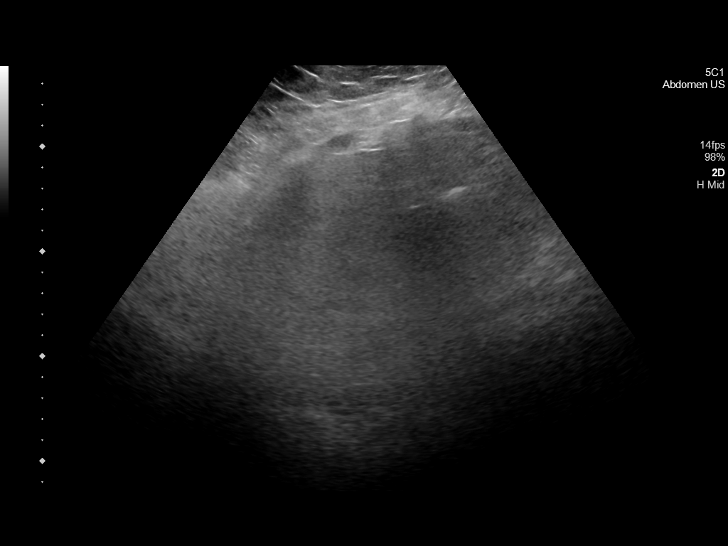
[im 34/34]
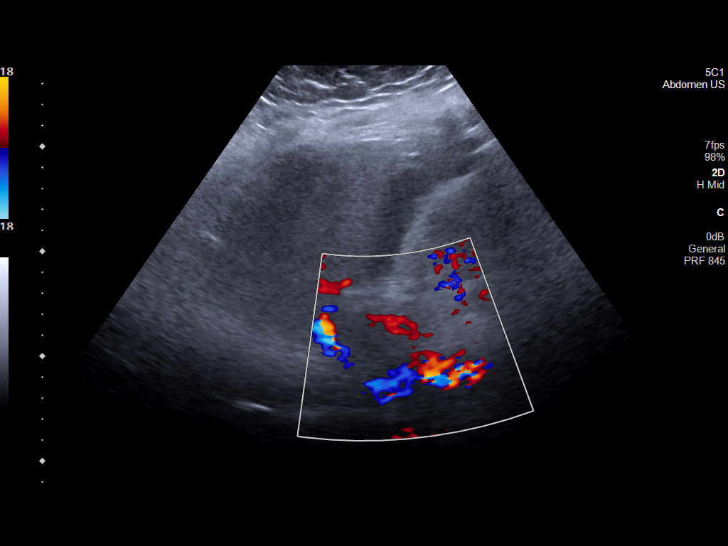

[14 of 25 positions shown; findings below may reference images not displayed]

FINDINGS: Gallbladder:

There is sludge in the gallbladder with no wall thickening,
pericholecystic fluid, stones, or Murphy's sign.

Common bile duct:

Diameter: 3.5 mm

Liver:

Increased echogenicity with no focal mass. Portal vein is patent on
color Doppler imaging with normal direction of blood flow towards
the liver.

Other: None.
IMPRESSION: 1. Mild sludge in the gallbladder. The gallbladder is otherwise
normal in appearance.
2. Probable hepatic steatosis with increased echogenicity in the
liver.

## 2021-11-29 IMAGING — DX DG CHEST 1V PORT
1 series · 1 of 1 positions shown · non-contrast
Comparison: 10/31/2020, 10/28/2020

CLINICAL DATA: Unsuccessful attempt at left IJ

EXAM:
PORTABLE CHEST 1 VIEW

[chest ap]
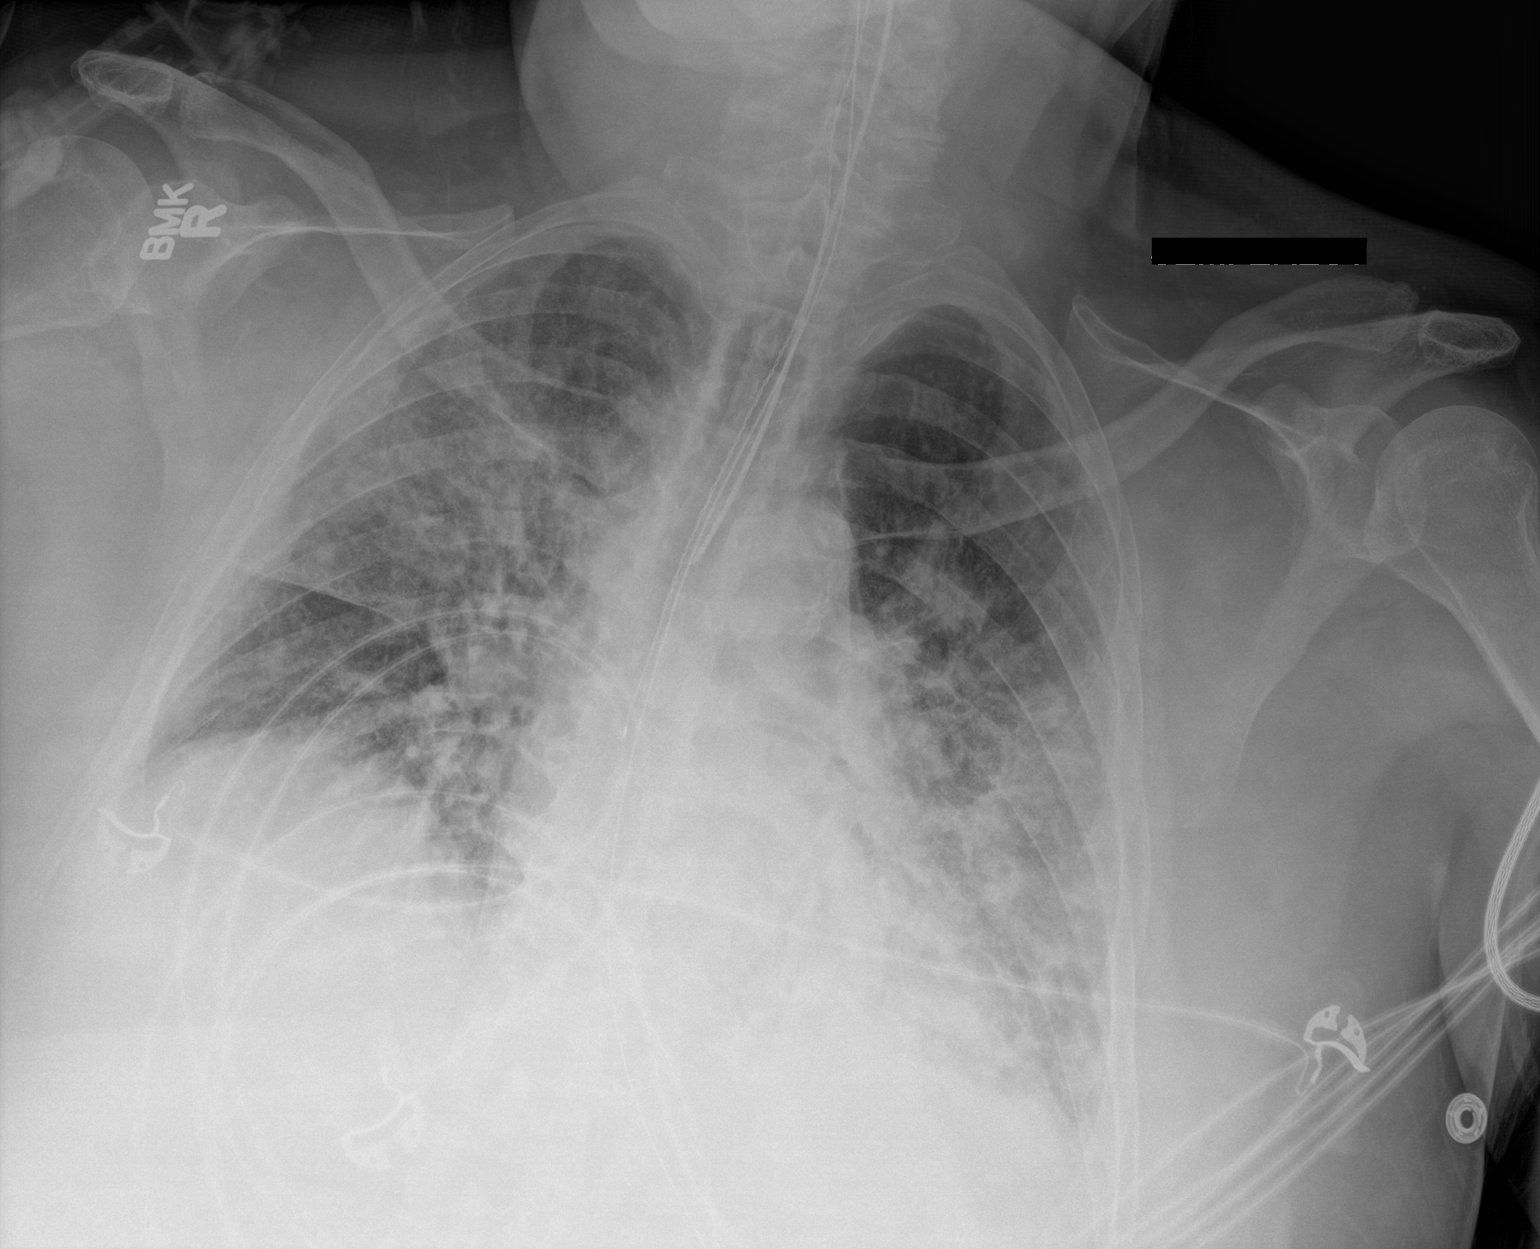

[1 of 1 positions shown; findings below may reference images not displayed]

FINDINGS: Endotracheal tube tip about a cm superior to the carina. Esophageal
tube tip below the diaphragm but incompletely visualized. Additional
tubing with punctate radiopaque marker overlies the distal
mediastinum. No central venous catheter is evident. There is no
pneumothorax. Extensive bilateral airspace infiltrates without
significant change.
IMPRESSION: 1. No significant interval change in extensive bilateral airspace
disease.
2. Negative for left pneumothorax. Endotracheal tube tip about a cm
superior to the carina.

## 2021-11-29 IMAGING — DX DG ABD PORTABLE 1V
1 series · 1 of 1 positions shown · non-contrast
Comparison: Portable chest from the same time today.

CLINICAL DATA: 74-year-old male positive 2PTA4-H9.  Intubated.

EXAM:
PORTABLE ABDOMEN - 1 VIEW

[abdomen supine]
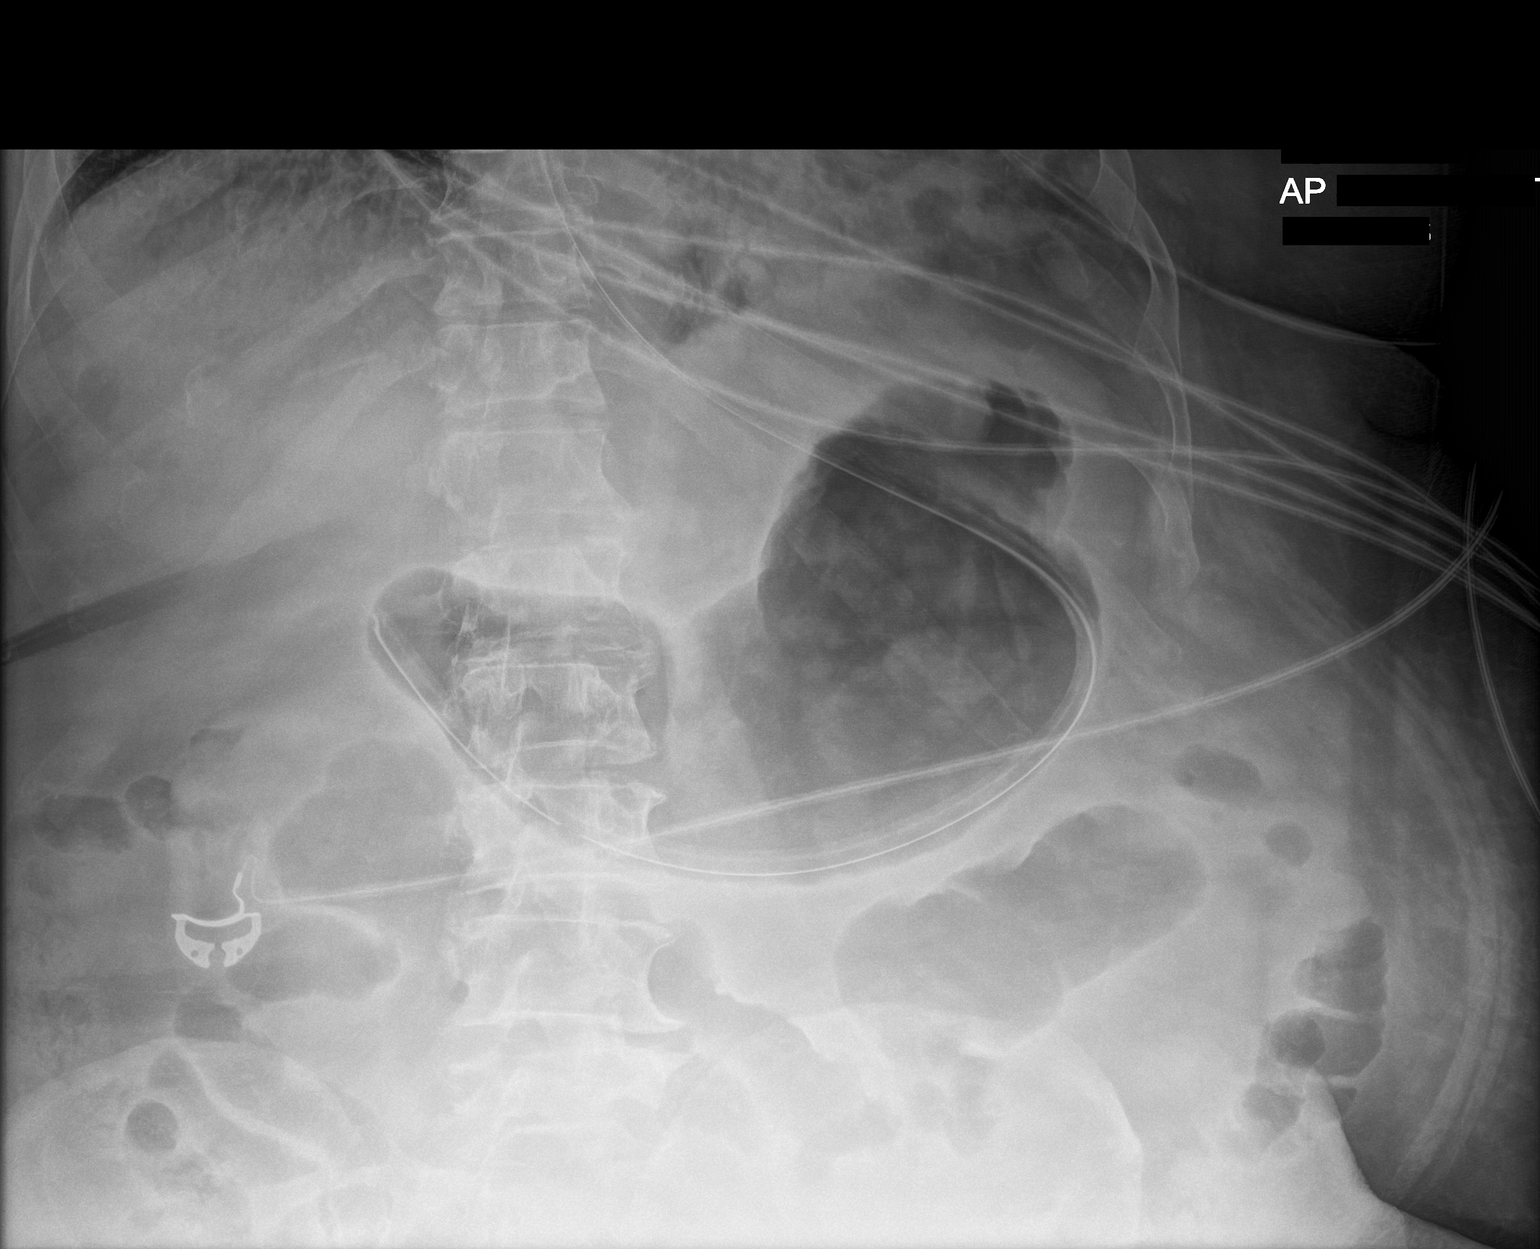

[1 of 1 positions shown; findings below may reference images not displayed]

FINDINGS: Portable AP semi upright view at 1625 hours. Enteric tube courses
into the stomach with tip at the gastric antrum. Side hole is at the
junction of the distal body and antrum. Visible bowel gas pattern is
within normal limits. No acute osseous abnormality identified.
IMPRESSION: Enteric tube placed with tip at the distal stomach.
